# Patient Record
Sex: Male | Born: 1965 | Race: White | Hispanic: No | Marital: Married | Smoking: Never smoker
Health system: Southern US, Community
[De-identification: ages and names within clinical notes are randomized; demographics above are authoritative.]

## PROBLEM LIST (undated history)

## (undated) DIAGNOSIS — K219 Gastro-esophageal reflux disease without esophagitis: Secondary | ICD-10-CM

## (undated) DIAGNOSIS — E7800 Pure hypercholesterolemia, unspecified: Secondary | ICD-10-CM

## (undated) DIAGNOSIS — IMO0001 Reserved for inherently not codable concepts without codable children: Secondary | ICD-10-CM

## (undated) DIAGNOSIS — J302 Other seasonal allergic rhinitis: Secondary | ICD-10-CM

## (undated) DIAGNOSIS — G4730 Sleep apnea, unspecified: Secondary | ICD-10-CM

## (undated) DIAGNOSIS — L309 Dermatitis, unspecified: Secondary | ICD-10-CM

## (undated) HISTORY — PX: CIRCUMCISION: SUR203

---

## 2011-01-01 ENCOUNTER — Encounter: Payer: Self-pay | Admitting: *Deleted

## 2011-01-01 ENCOUNTER — Emergency Department (HOSPITAL_BASED_OUTPATIENT_CLINIC_OR_DEPARTMENT_OTHER)
Admission: EM | Admit: 2011-01-01 | Discharge: 2011-01-01 | Disposition: A | Discharge status: Home / Self Care | Payer: Managed Care, Other (non HMO) | Attending: Emergency Medicine | Admitting: Emergency Medicine

## 2011-01-01 ENCOUNTER — Emergency Department (INDEPENDENT_AMBULATORY_CARE_PROVIDER_SITE_OTHER): Payer: Managed Care, Other (non HMO)

## 2011-01-01 DIAGNOSIS — E7800 Pure hypercholesterolemia, unspecified: Secondary | ICD-10-CM | POA: Insufficient documentation

## 2011-01-01 DIAGNOSIS — R51 Headache: Secondary | ICD-10-CM

## 2011-01-01 DIAGNOSIS — R1110 Vomiting, unspecified: Secondary | ICD-10-CM | POA: Insufficient documentation

## 2011-01-01 HISTORY — DX: Other seasonal allergic rhinitis: J30.2

## 2011-01-01 HISTORY — DX: Sleep apnea, unspecified: G47.30

## 2011-01-01 HISTORY — DX: Gastro-esophageal reflux disease without esophagitis: K21.9

## 2011-01-01 HISTORY — DX: Pure hypercholesterolemia, unspecified: E78.00

## 2011-01-01 HISTORY — DX: Reserved for inherently not codable concepts without codable children: IMO0001

## 2011-01-01 LAB — CBC
Hemoglobin: 14.9 g/dL (ref 13.0–17.0)
MCHC: 35.7 g/dL (ref 30.0–36.0)
RDW: 13.9 % (ref 11.5–15.5)

## 2011-01-01 LAB — COMPREHENSIVE METABOLIC PANEL
AST: 17 U/L (ref 0–37)
Albumin: 4 g/dL (ref 3.5–5.2)
Alkaline Phosphatase: 71 U/L (ref 39–117)
Chloride: 105 mEq/L (ref 96–112)
Potassium: 3.8 mEq/L (ref 3.5–5.1)
Sodium: 140 mEq/L (ref 135–145)
Total Bilirubin: 0.3 mg/dL (ref 0.3–1.2)

## 2011-01-01 LAB — DIFFERENTIAL
Basophils Absolute: 0.1 10*3/uL (ref 0.0–0.1)
Basophils Relative: 1 % (ref 0–1)
Neutro Abs: 4.9 10*3/uL (ref 1.7–7.7)
Neutrophils Relative %: 61 % (ref 43–77)

## 2011-01-01 MED ORDER — ONDANSETRON HCL 4 MG/2ML IJ SOLN
4.0000 mg | Freq: Once | INTRAMUSCULAR | Status: AC
  Administered 2011-01-01: 4 mg via INTRAVENOUS
  Filled 2011-01-01: qty 2

## 2011-01-01 MED ORDER — ONDANSETRON 8 MG PO TBDP: 8.0000 mg | ORAL_TABLET | Freq: Three times a day (TID) | ORAL | Status: AC | PRN

## 2011-01-01 MED ORDER — PROMETHAZINE HCL 25 MG/ML IJ SOLN
12.5000 mg | Freq: Once | INTRAMUSCULAR | Status: AC
  Administered 2011-01-01: 12.5 mg via INTRAVENOUS
  Filled 2011-01-01: qty 1

## 2011-01-01 MED ORDER — SODIUM CHLORIDE 0.9 % IV BOLUS (SEPSIS)
500.0000 mL | Freq: Once | INTRAVENOUS | Status: AC
  Administered 2011-01-01: 500 mL via INTRAVENOUS

## 2011-01-01 MED ORDER — SODIUM CHLORIDE 0.9 % IV SOLN
Freq: Once | INTRAVENOUS | Status: AC
  Administered 2011-01-01: 21:00:00 via INTRAVENOUS

## 2011-01-01 MED ORDER — KETOROLAC TROMETHAMINE 30 MG/ML IJ SOLN
30.0000 mg | Freq: Once | INTRAMUSCULAR | Status: AC
  Administered 2011-01-01: 30 mg via INTRAVENOUS
  Filled 2011-01-01: qty 1

## 2011-01-01 NOTE — ED Notes (Addendum)
rx x 1 given for zofran ODT. Pt has family here to drive

## 2011-01-01 NOTE — ED Notes (Signed)
Transported to xray 

## 2011-01-01 NOTE — ED Notes (Signed)
Pt reports nausea relieved after phenergan

## 2011-01-01 NOTE — ED Notes (Signed)
Pt actively vomiting- EDP Notified- [facility] for phenergan 12.5mg 

## 2011-01-01 NOTE — ED Provider Notes (Signed)
Scribed for Geoffery Lyons, MD, the patient was seen in room MH04/MH04 . This chart was scribed by Ellie Lunch. This patient's care was started at 7:54 PM.   CSN: [id number] Arrival date & time: 01/01/2011  7:27 PM  Chief Complaint  Patient presents with  . Emesis    (Consider location/radiation/quality/duration/timing/severity/associated sxs/prior treatment) HPI Lucas Woods is a normally healthy 45 y.o. male who presents to the Emergency Department complaining of HA with associated emesis starting last night. Pt reports a HA starting last night and having one episode of emesis. A HA developed again around 4pm today. Pain is described as constant and rated 5/10 in severity. Pt treated with 2 tesnion HA pills with little improvement. Pt also reports additional episodes of emesis since onset. Pt says he sees little flashing lights after vomiting similar to flashing lights you would see before fainting. HA is located in the frontal forehead region. Pt denies any other visual disturbance, abd pain, fever, diarrhea. No reported sick contacts. No suspicious food.  No H/o of migraines or HA.  Additionally Pt reports he Increased his dose of testosterone from 1 to 3 "squirts" starting yesterday. Testosterone 30 mg.   Past Medical History  Diagnosis Date  . Seasonal allergies   . Reflux   . Hypercholesteremia   Kleinfelders  Past Surgical History  Procedure Date  . Circumcision     History reviewed. No pertinent family history.  History  Substance Use Topics  . Smoking status: Never Smoker   . Smokeless tobacco: Not on file  . Alcohol Use: Yes    Review of Systems 10 Systems reviewed and are negative for acute change except as noted in the HPI.  Allergies  Review of patient's allergies indicates no known allergies.  Home Medications   Current Outpatient Rx  Name Route Sig Dispense Refill  . CLONAZEPAM 0.5 MG PO TABS Oral Take 0.5 mg by mouth at bedtime.      Marland Kitchen DIPHENHYDRAMINE  HCL 25 MG PO TABS Oral Take 50 mg by mouth once as needed. For allergies     . GEMFIBROZIL 600 MG PO TABS Oral Take 600 mg by mouth 2 (two) times daily before a meal.      . IBUPROFEN 200 MG PO TABS Oral Take 600 mg by mouth once as needed. For pain     . OMEPRAZOLE 20 MG PO CPDR Oral Take 20 mg by mouth daily.      Marland Kitchen PHENYLTOLOXAMINE-ACETAMINOPHEN 30-325 MG PO TABS Oral Take 2 tablets by mouth once as needed. For headache    . TESTOSTERONE 30 MG/ACT TD SOLN Transdermal Place 3 Squirts onto the skin daily.        BP 159/105  Pulse 88  Temp(Src) 97.5 F (36.4 C) (Oral)  Resp 20  Ht 5\' 7"  (1.702 m)  Wt 180 lb (81.647 kg)  BMI 28.19 kg/m2  SpO2 100%  Physical Exam  Nursing note and vitals reviewed. Constitutional: He is oriented to person, place, and time. He appears well-developed and well-nourished.  HENT:  Head: Normocephalic and atraumatic.  Mouth/Throat: Oropharynx is clear and moist.  Eyes: Conjunctivae and EOM are normal. Pupils are equal, round, and reactive to light.  Neck: Normal range of motion. Neck supple.  Cardiovascular: Normal rate, regular rhythm and normal heart sounds.   Pulmonary/Chest: Effort normal and breath sounds normal. No respiratory distress.  Abdominal: Soft. There is no tenderness. There is no CVA tenderness.  Musculoskeletal: Normal range of motion.  Neurological: He is  alert and oriented to person, place, and time. No cranial nerve deficit or sensory deficit.  Skin: Skin is warm and dry.  Psychiatric: He has a normal mood and affect.   Procedures (including critical care time)  OTHER DATA REVIEWED: Nursing notes, vital signs, and past medical records reviewed.  DIAGNOSTIC STUDIES: Oxygen Saturation is 100% on room air, normal by my interpretation.    LABS / RADIOLOGY:  Labs Reviewed  DIFFERENTIAL - Abnormal; Notable for the following:    Eosinophils Relative 7 (*)    All other components within normal limits  COMPREHENSIVE METABOLIC PANEL -  Abnormal; Notable for the following:    GFR calc non Af Amer 73 (*)    GFR calc Af Amer 84 (*)    All other components within normal limits  CBC   Ct Head Wo Contrast  01/01/2011  *RADIOLOGY REPORT*  Clinical Data: Unexplained vomiting since last night.  No injury or trauma.  CT HEAD WITHOUT CONTRAST  Technique:  Contiguous axial images were obtained from the base of the skull through the vertex without contrast.  Comparison: None.  Findings: Ventricles and sulci appear symmetrical.  No mass effect or midline shift.  No abnormal extra-axial fluid collections.  Wallace Cullens- white matter junctions are distinct.  Basal cisterns are not effaced.  No evidence of acute intracranial hemorrhage.  No depressed skull fractures.  Focal mucosal membrane thickening or retention cyst in the sphenoid sinus.  Opacification of the right ethmoid air cell.  No acute air-fluid levels demonstrated in the paranasal sinuses.  IMPRESSION: No evidence of acute intracranial hemorrhage, mass lesion, or acute infarct.  Original Report Authenticated By: Marlon Pel, M.D.    ED COURSE / COORDINATION OF CARE: 20:00 EDP at Pt bedside. Discussed treatment plan including fluids, nausea and pain medication. Plan to CT of head.   MDM: Ct of head and labs look okay.  It appears as though this is some sort of migraine or viral enteritis.  No apparent emergent pathology.  Feels better with meds.  Will discharge with antiemetics and time.  MEDICATIONS GIVEN IN THE E.D.  Medications  0.9 %  sodium chloride infusion (0  Intravenous Stopped 01/01/11 2117)  ketorolac (TORADOL) 30 MG/ML injection 30 mg (30 mg Intravenous Given 01/01/11 2030)  ondansetron (ZOFRAN) injection 4 mg (4 mg Intravenous Given 01/01/11 2033)  promethazine (PHENERGAN) injection 12.5 mg (12.5 mg Intravenous Given 01/01/11 2121)  sodium chloride 0.9 % bolus 500 mL (500 mL Intravenous Given 01/01/11 2121)   DISCHARGE MEDICATIONS: New Prescriptions   ONDANSETRON (ZOFRAN  ODT) 8 MG DISINTEGRATING TABLET    Take 1 tablet (8 mg total) by mouth every 8 (eight) hours as needed for nausea.    SCRIBE ATTESTATION: I personally performed the services described in this documentation, which was scribed in my presence. The recorded information has been reviewed and considered.          Geoffery Lyons, MD 01/01/11 2255

## 2011-01-01 NOTE — ED Notes (Signed)
Pt states he began vomiting last p.m. "Seeing flashes of light"

## 2011-01-01 NOTE — ED Notes (Signed)
MD at bedside. EDP Delo in with pt 

## 2012-10-19 IMAGING — CT CT HEAD W/O CM
1 series · 16 of 30 positions shown, 20 images · non-contrast
Comparison: None.

CLINICAL DATA: Unexplained vomiting since last night.  No injury or
trauma.

CT HEAD WITHOUT CONTRAST
TECHNIQUE: Contiguous axial images were obtained from the base of
the skull through the vertex without contrast.

[Series 2: head 4.8 h37s · axial · 0.47mm/px · z∈[-105,+55]mm · 16 of 36 slices shown, 20 images]
[im 2/36  brain]
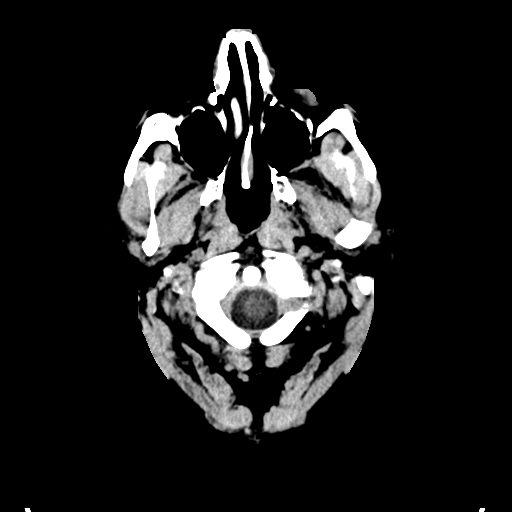
[im 2/36  bone]
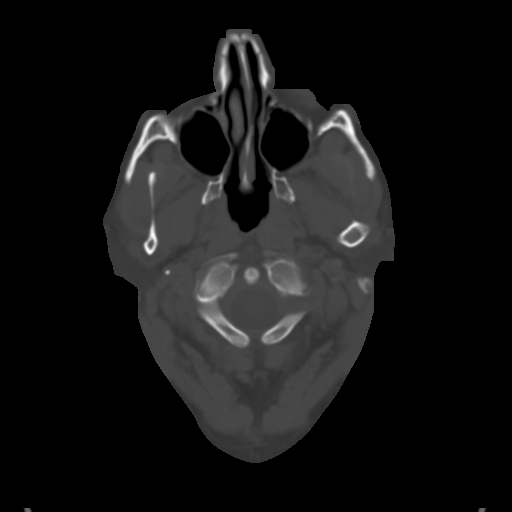
[im 4/36  brain]
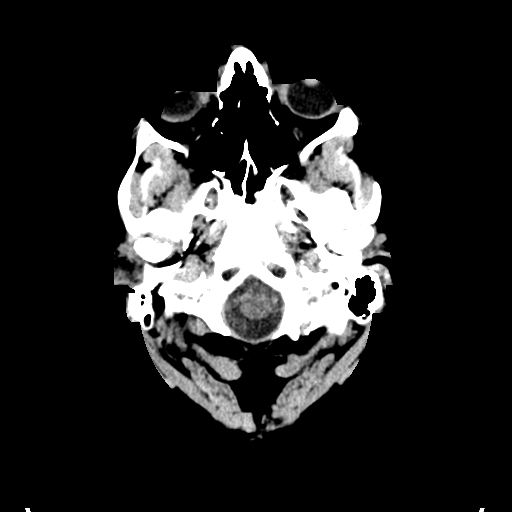
[im 7/36  brain]
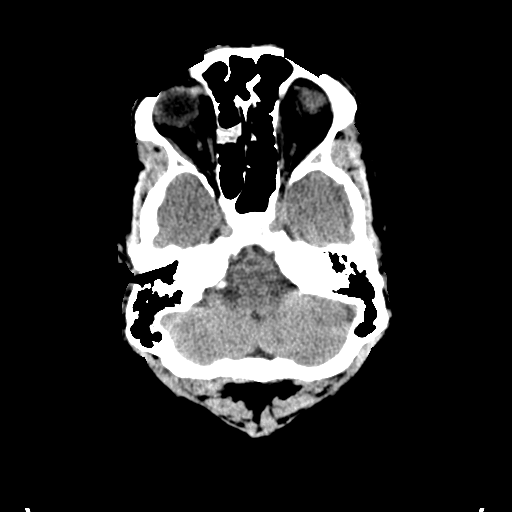
[im 9/36  brain]
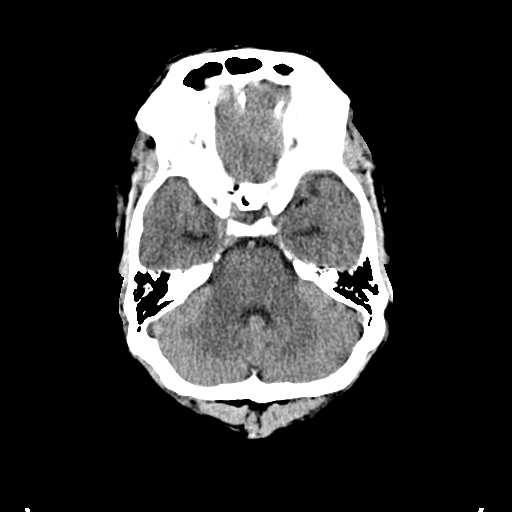
[im 10/36  brain]
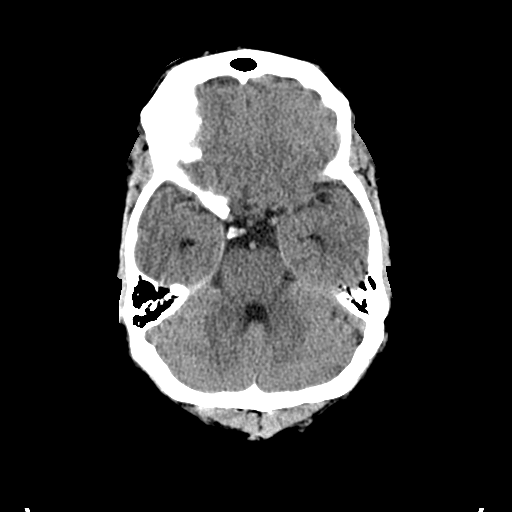
[im 10/36  bone]
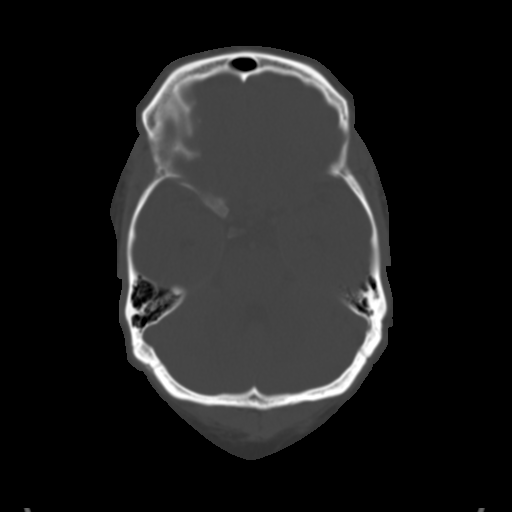
[im 13/36  brain]
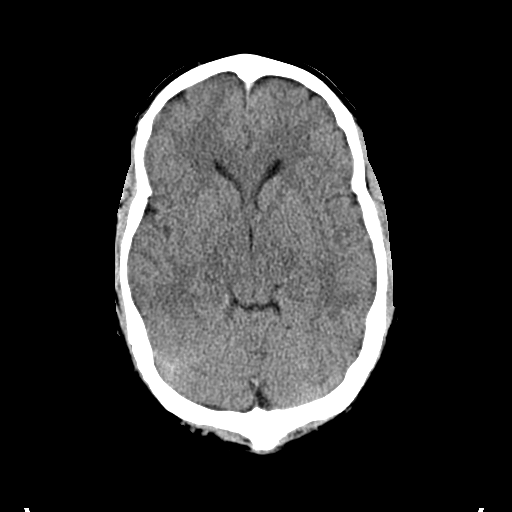
[im 15/36  brain]
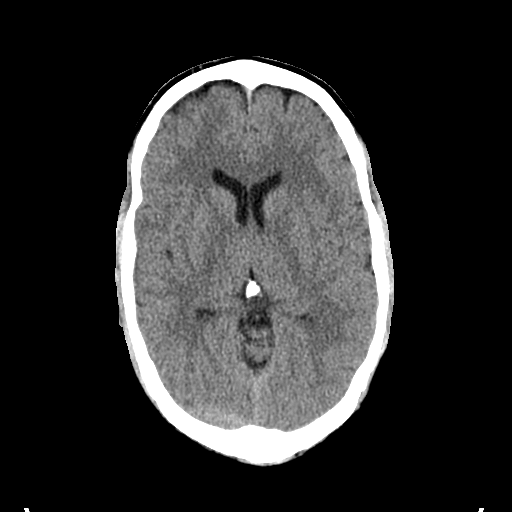
[im 17/36  brain]
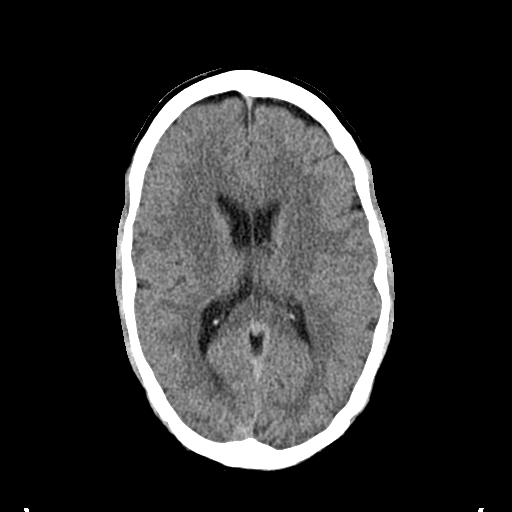
[im 19/36  brain]
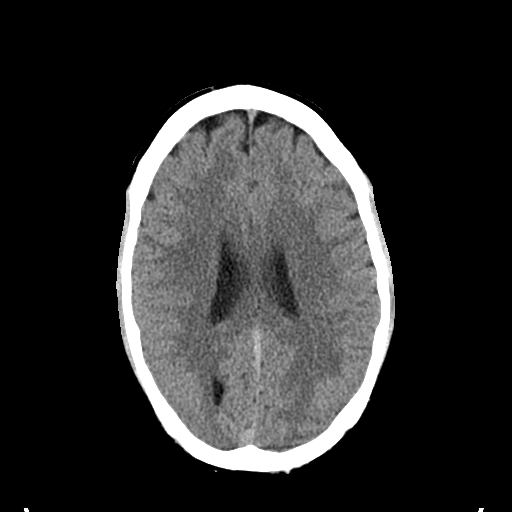
[im 19/36  bone]
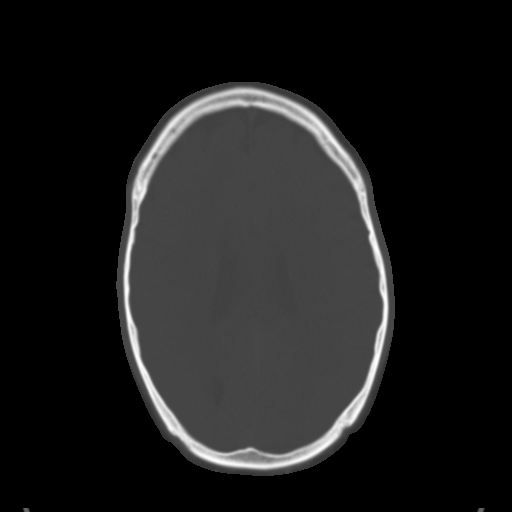
[im 21/36  brain]
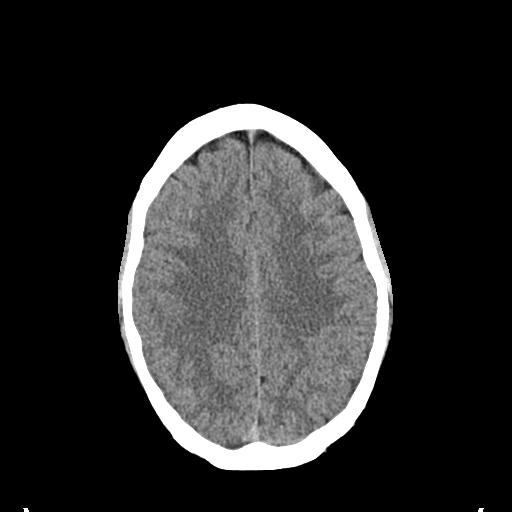
[im 23/36  brain]
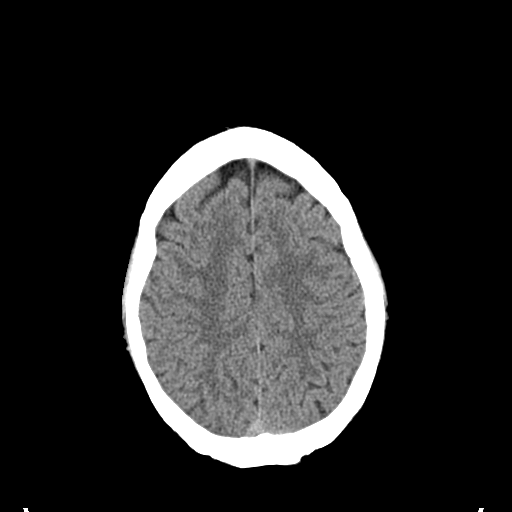
[im 26/36  brain]
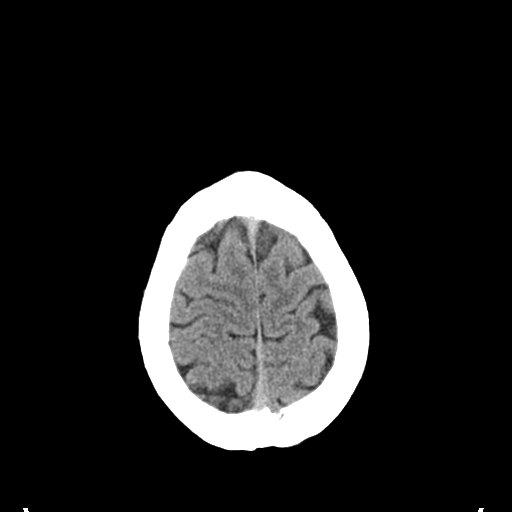
[im 27/36  brain]
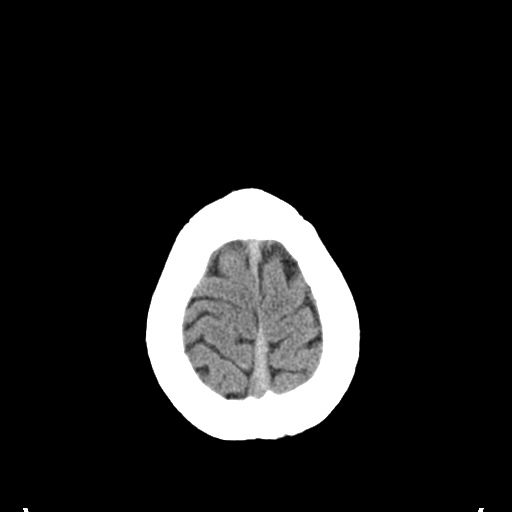
[im 27/36  bone]
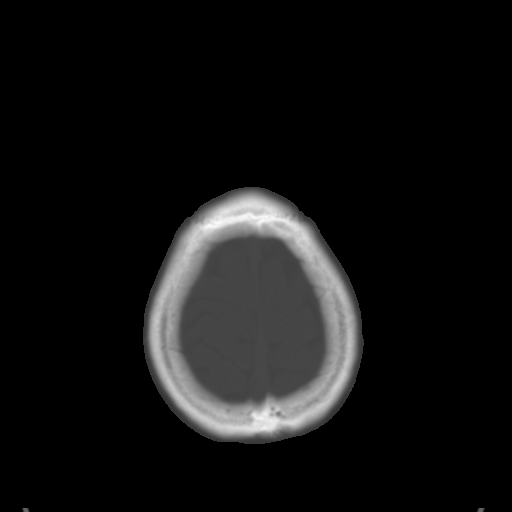
[im 29/36  brain]
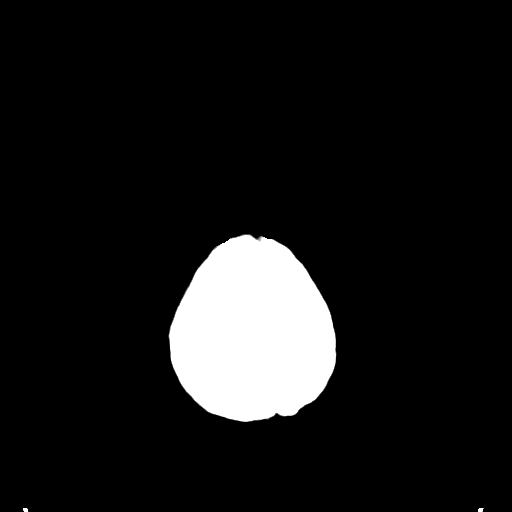
[im 32/36  brain]
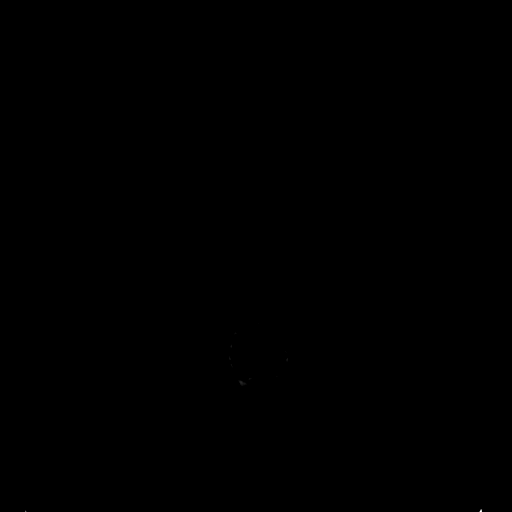
[im 34/36  brain]
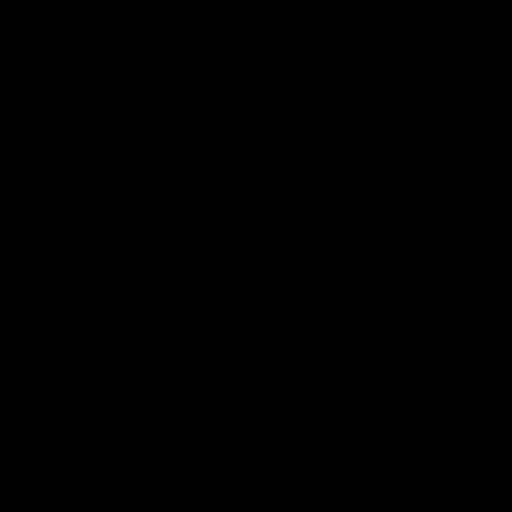

[16 of 30 positions shown; findings below may reference images not displayed]

FINDINGS: Ventricles and sulci appear symmetrical.  No mass effect
or midline shift.  No abnormal extra-axial fluid collections.  Gray-
white matter junctions are distinct.  Basal cisterns are not
effaced.  No evidence of acute intracranial hemorrhage.  No
depressed skull fractures.  Focal mucosal membrane thickening or
retention cyst in the sphenoid sinus.  Opacification of the right
ethmoid air cell.  No acute air-fluid levels demonstrated in the
paranasal sinuses.
IMPRESSION: No evidence of acute intracranial hemorrhage, mass lesion, or acute
infarct.

## 2013-10-30 ENCOUNTER — Institutional Professional Consult (permissible substitution): Payer: Managed Care, Other (non HMO) | Admitting: Pulmonary Disease

## 2014-06-07 ENCOUNTER — Emergency Department (HOSPITAL_BASED_OUTPATIENT_CLINIC_OR_DEPARTMENT_OTHER)
Admission: EM | Admit: 2014-06-07 | Discharge: 2014-06-08 | Disposition: A | Discharge status: Home / Self Care | Payer: BLUE CROSS/BLUE SHIELD | Attending: Emergency Medicine | Admitting: Emergency Medicine

## 2014-06-07 ENCOUNTER — Encounter (HOSPITAL_BASED_OUTPATIENT_CLINIC_OR_DEPARTMENT_OTHER): Payer: Self-pay | Admitting: Emergency Medicine

## 2014-06-07 DIAGNOSIS — Z79899 Other long term (current) drug therapy: Secondary | ICD-10-CM | POA: Diagnosis not present

## 2014-06-07 DIAGNOSIS — Z8639 Personal history of other endocrine, nutritional and metabolic disease: Secondary | ICD-10-CM | POA: Diagnosis not present

## 2014-06-07 DIAGNOSIS — R112 Nausea with vomiting, unspecified: Secondary | ICD-10-CM | POA: Insufficient documentation

## 2014-06-07 DIAGNOSIS — R51 Headache: Secondary | ICD-10-CM | POA: Insufficient documentation

## 2014-06-07 DIAGNOSIS — K219 Gastro-esophageal reflux disease without esophagitis: Secondary | ICD-10-CM | POA: Diagnosis not present

## 2014-06-07 DIAGNOSIS — Z8669 Personal history of other diseases of the nervous system and sense organs: Secondary | ICD-10-CM | POA: Diagnosis not present

## 2014-06-07 DIAGNOSIS — R519 Headache, unspecified: Secondary | ICD-10-CM

## 2014-06-07 MED ORDER — METOCLOPRAMIDE HCL 5 MG/ML IJ SOLN
10.0000 mg | Freq: Once | INTRAMUSCULAR | Status: AC
  Administered 2014-06-07: 10 mg via INTRAVENOUS
  Filled 2014-06-07: qty 2

## 2014-06-07 MED ORDER — KETOROLAC TROMETHAMINE 30 MG/ML IJ SOLN
30.0000 mg | Freq: Once | INTRAMUSCULAR | Status: AC
  Administered 2014-06-07: 30 mg via INTRAVENOUS
  Filled 2014-06-07: qty 1

## 2014-06-07 MED ORDER — SODIUM CHLORIDE 0.9 % IV BOLUS (SEPSIS)
1000.0000 mL | Freq: Once | INTRAVENOUS | Status: AC
  Administered 2014-06-07: 1000 mL via INTRAVENOUS

## 2014-06-07 MED ORDER — DIPHENHYDRAMINE HCL 50 MG/ML IJ SOLN
25.0000 mg | Freq: Once | INTRAMUSCULAR | Status: AC
  Administered 2014-06-07: 25 mg via INTRAVENOUS
  Filled 2014-06-07: qty 1

## 2014-06-07 NOTE — ED Notes (Signed)
Pt awoke this morning with headache.  After lunch, pt started feeling nauseous as well as headache.  Pt took one phenergan and imitrex as prescribed for him, felt cold and clammy per patient.  After taking medicines, pt feels less nauseous, but headache still persists.

## 2014-06-07 NOTE — ED Notes (Signed)
Pt re4ports headache with hx migraines Imitrex 1 hr ago w/o relief

## 2014-06-08 NOTE — Discharge Instructions (Signed)
Make sure to get plenty of rest. Excedrin headache for needed tomorrow. Rest. Avoid stressors. Follow with primary care doctor.    General Headache Without Cause A headache is pain or discomfort felt around the head or neck area. The specific cause of a headache may not be found. There are many causes and types of headaches. A few common ones are:  Tension headaches.  Migraine headaches.  Cluster headaches.  Chronic daily headaches. HOME CARE INSTRUCTIONS   Keep all follow-up appointments with your caregiver or any specialist referral.  Only take over-the-counter or prescription medicines for pain or discomfort as directed by your caregiver.  Lie down in a dark, quiet room when you have a headache.  Keep a headache journal to find out what may trigger your migraine headaches. For example, write down:  What you eat and drink.  How much sleep you get.  Any change to your diet or medicines.  Try massage or other relaxation techniques.  Put ice packs or heat on the head and neck. Use these 3 to 4 times per day for 15 to 20 minutes each time, or as needed.  Limit stress.  Sit up straight, and do not tense your muscles.  Quit smoking if you smoke.  Limit alcohol use.  Decrease the amount of caffeine you drink, or stop drinking caffeine.  Eat and sleep on a regular schedule.  Get 7 to 9 hours of sleep, or as recommended by your caregiver.  Keep lights dim if bright lights bother you and make your headaches worse. SEEK MEDICAL CARE IF:   You have problems with the medicines you were prescribed.  Your medicines are not working.  You have a change from the usual headache.  You have nausea or vomiting. SEEK IMMEDIATE MEDICAL CARE IF:   Your headache becomes severe.  You have a fever.  You have a stiff neck.  You have loss of vision.  You have muscular weakness or loss of muscle control.  You start losing your balance or have trouble walking.  You feel  faint or pass out.  You have severe symptoms that are different from your first symptoms. MAKE SURE YOU:   Understand these instructions.  Will watch your condition.  Will get help right away if you are not doing well or get worse. Document Released: 03/13/2005 Document Revised: 06/05/2011 Document Reviewed: 03/29/2011 [facility] Patient Information 2015 [employer], [place]. This information is not intended to replace advice given to you by your health care provider. Make sure you discuss any questions you have with your health care provider.

## 2014-06-08 NOTE — ED Provider Notes (Signed)
CSN: [id number]     Arrival date & time 06/07/14  2030 History   First MD Initiated Contact with Patient 06/07/14 2238     Chief Complaint  Patient presents with  . Migraine     (Consider location/radiation/quality/duration/timing/severity/associated sxs/prior Treatment) HPI Lucas Woods is a 49 y.o. male with history of seasonal allergies, migraines, GERD, presents to ED with complaint of a headache. Patient states the headache started this morning. States that it gradually worsened throughout the day. He reports associated nausea, one episode of vomiting. He reports associated photophobia. He denies any sinus congestion. He denies any neck pain or stiffness. He denies visual changes. He states onset of headache with gradual as well. His wife gave him Imitrex which did not help. He also tried Phenergan later in the day which did not help. He states he has had similar headaches in the past. He also admits to being under a lot of stress recently. He has tried many his headache better today. Headache sharp, across the front of his head.  Past Medical History  Diagnosis Date  . Seasonal allergies   . Reflux   . Hypercholesteremia   . Sleep apnea    Past Surgical History  Procedure Laterality Date  . Circumcision     History reviewed. No pertinent family history. History  Substance Use Topics  . Smoking status: Never Smoker   . Smokeless tobacco: Not on file  . Alcohol Use: Yes    Review of Systems  Constitutional: Negative for fever and chills.  Eyes: Positive for photophobia. Negative for pain and redness.  Respiratory: Negative for cough, chest tightness and shortness of breath.   Cardiovascular: Negative for chest pain, palpitations and leg swelling.  Gastrointestinal: Positive for nausea and vomiting. Negative for abdominal pain, diarrhea and abdominal distention.  Genitourinary: Negative for dysuria, urgency, frequency and hematuria.  Musculoskeletal: Negative for myalgias,  arthralgias, neck pain and neck stiffness.  Skin: Negative for rash.  Allergic/Immunologic: Negative for immunocompromised state.  Neurological: Positive for headaches. Negative for dizziness, weakness, light-headedness and numbness.  All other systems reviewed and are negative.     Allergies  Review of patient's allergies indicates no known allergies.  Home Medications   Prior to Admission medications   Medication Sig Start Date End Date Taking? Authorizing Provider  clonazePAM (KLONOPIN) 0.5 MG tablet Take 0.5 mg by mouth at bedtime.      Historical Provider, MD  diphenhydrAMINE (BENADRYL) 25 MG tablet Take 50 mg by mouth once as needed. For allergies     Historical Provider, MD  gemfibrozil (LOPID) 600 MG tablet Take 600 mg by mouth 2 (two) times daily before a meal.      Historical Provider, MD  ibuprofen (ADVIL,MOTRIN) 200 MG tablet Take 600 mg by mouth once as needed. For pain     Historical Provider, MD  omeprazole (PRILOSEC) 20 MG capsule Take 20 mg by mouth daily.      Historical Provider, MD  phenyltoloxamine-acetaminophen 30-325 MG per tablet Take 2 tablets by mouth once as needed. For headache    Historical Provider, MD  Testosterone (AXIRON) 30 MG/ACT SOLN Place 3 Squirts onto the skin daily.      Historical Provider, MD   BP 151/108 mmHg  Pulse 89  Temp(Src) 98 F (36.7 C) (Oral)  Resp 20  Ht  (1.702 m)  Wt 190 lb (86.183 kg)  BMI 29.75 kg/m2  SpO2 100% Physical Exam  Constitutional: He is oriented to person, place, and time.  He appears well-developed and well-nourished. No distress.  HENT:  Head: Normocephalic and atraumatic.  Right Ear: External ear normal.  Left Ear: External ear normal.  Nose: Nose normal.  Mouth/Throat: Oropharynx is clear and moist.  Eyes: Conjunctivae and EOM are normal. Pupils are equal, round, and reactive to light.  Neck: Normal range of motion. Neck supple.  Cardiovascular: Normal rate, regular rhythm and normal heart sounds.    Pulmonary/Chest: Effort normal and breath sounds normal. No respiratory distress. He has no wheezes. He has no rales.  Abdominal: Soft. There is no tenderness.  Musculoskeletal: He exhibits no edema.  Neurological: He is alert and oriented to person, place, and time.  5/5 and equal upper and lower extremity strength bilaterally. Equal grip strength bilaterally. Normal finger to nose and heel to shin. No pronator drift.   Skin: Skin is warm and dry.  Nursing note and vitals reviewed.   ED Course  Procedures (including critical care time) Labs Review Labs Reviewed - No data to display  Imaging Review No results found.   EKG Interpretation None      MDM   Final diagnoses:  Nonintractable headache, unspecified chronicity pattern, unspecified headache type    patient with history of migraines, here with gradual onset of headache this morning with associated photophobia and nausea and vomiting. History of similar headaches in the past. He has not had any head injuries. He denies any neurological symptoms. Exam is unremarkable. Most likely another migraine headache. Will treat with migraine cocktail which includes Toradol, Reglan, Benadryl.  12:40 AM  Patient is feeling much better. Headache resolved. He wants to go home. We'll discharge home with Excedrin Migraine. Follow-up with primary care doctor.  Filed Vitals:   06/07/14 2050 06/08/14 0034  BP: 151/108 123/84  Pulse: 89 92  Temp: 98 F (36.7 C) 97.9 F (36.6 C)  TempSrc: Oral Oral  Resp: 20 18  Height: 5\' 7"  (1.702 m)   Weight: 190 lb (86.183 kg)   SpO2: 100% 97%     Jaynie Crumbleatyana Morrill Bomkamp, PA-C 06/08/14 0114  Jerelyn ScottMartha Linker, MD 06/09/14 [phone]

## 2014-10-06 ENCOUNTER — Emergency Department
Admission: EM | Admit: 2014-10-06 | Discharge: 2014-10-06 | Disposition: A | Discharge status: Home / Self Care | Payer: BLUE CROSS/BLUE SHIELD | Source: Home / Self Care | Attending: Family Medicine | Admitting: Family Medicine

## 2014-10-06 ENCOUNTER — Encounter: Payer: Self-pay | Admitting: *Deleted

## 2014-10-06 DIAGNOSIS — L309 Dermatitis, unspecified: Secondary | ICD-10-CM | POA: Diagnosis not present

## 2014-10-06 DIAGNOSIS — L259 Unspecified contact dermatitis, unspecified cause: Secondary | ICD-10-CM

## 2014-10-06 MED ORDER — TRIAMCINOLONE ACETONIDE 40 MG/ML IJ SUSP
40.0000 mg | Freq: Once | INTRAMUSCULAR | Status: AC
  Administered 2014-10-06: 40 mg via INTRAMUSCULAR

## 2014-10-06 MED ORDER — PREDNISONE 20 MG PO TABS: ORAL_TABLET | ORAL | Status: DC

## 2014-10-06 MED ORDER — TRIAMCINOLONE ACETONIDE 0.1 % EX CREA: 1.0000 "application " | TOPICAL_CREAM | Freq: Two times a day (BID) | CUTANEOUS | Status: AC

## 2014-10-06 NOTE — Discharge Instructions (Signed)
Today you have been diagnosed with contact dermatitis, likely from poison ivy.  Be sure to wash all clothing, sheets and towels used from time of contact with poison ivy until now.  Gently wash the areas of rash with warm soapy water and pat dry, do not rub.    Today you were given a steroid injection of Kenalog.  Please allow at least 24 hours to see if symptoms are improving.  If only minimal improvement, you may start prednisone taper and apply triamcinolone cream.  As rash improves, please cut back on triamcinolone cream.   You may take an antihistamine such as Benadryl or Zyrtec as needed for itch, however, steroid should help with this.    Follow-up with primary care provider in one week if not improving.  Please seek medical care sooner if worsening including spreading of rash, increased pain, fever or other new concerning symptoms develop

## 2014-10-06 NOTE — ED Notes (Signed)
Pt c/o rash on his arms bilaterally x 3 days.

## 2014-10-06 NOTE — ED Provider Notes (Signed)
CSN: [id number]     Arrival date & time 10/06/14  1837 History   First MD Initiated Contact with Patient 10/06/14 1844     Chief Complaint  Patient presents with  . Rash   (Consider location/radiation/quality/duration/timing/severity/associated sxs/prior Treatment) HPI Patient is a 49 year old male presenting to urgent care with complaint of pruritic rash on bilateral arms that started 3 days ago after working in the yard.  Patient states he did come in contact with poison ivy and since then rash is worsening.  Patient states he has had a similar rash previously.  States he was seen for this same rash 2 days ago and was prescribed triamcinolone cream, which provided minimal relief.  Denies fevers, chills, nausea, vomiting, diarrhea.  Denies difficulty breathing or swallowing.  No other rashes on his body.  Patient does report history of eczema.  States he does have smaller lesions are consistent with his chronic eczema.  No sick contacts or recent travel.  No history of tick bites.  Past Medical History  Diagnosis Date  . Seasonal allergies   . Reflux   . Hypercholesteremia   . Sleep apnea    Past Surgical History  Procedure Laterality Date  . Circumcision     Family History  Problem Relation Age of Onset  . Diabetes Mother   . Heart failure Mother    History  Substance Use Topics  . Smoking status: Never Smoker   . Smokeless tobacco: Not on file  . Alcohol Use: No    Review of Systems  Constitutional: Negative for fever and chills.  Respiratory: Negative for cough, shortness of breath, wheezing and stridor.   Cardiovascular: Negative for chest pain and palpitations.  Gastrointestinal: Negative for nausea, vomiting, abdominal pain and diarrhea.  Musculoskeletal: Negative for myalgias, arthralgias, neck pain and neck stiffness.  Skin: Positive for rash and wound. Negative for color change.    Allergies  Review of patient's allergies indicates no known allergies.  Home  Medications   Prior to Admission medications   Medication Sig Start Date End Date Taking? Authorizing Provider  Armodafinil (NUVIGIL) 200 MG TABS Take by mouth.   Yes Historical Provider, MD  atorvastatin (LIPITOR) 10 MG tablet Take 10 mg by mouth daily.   Yes Historical Provider, MD  clomiPRAMINE (ANAFRANIL) 75 MG capsule Take 75 mg by mouth at bedtime.   Yes Historical Provider, MD  clotrimazole (LOTRIMIN) 1 % cream Apply 1 application topically 2 (two) times daily.   Yes Historical Provider, MD  Dexlansoprazole (DEXILANT PO) Take by mouth.   Yes Historical Provider, MD  diphenhydrAMINE (SOMINEX) 25 MG tablet Take 25 mg by mouth at bedtime as needed for sleep.   Yes Historical Provider, MD  folic acid (FOLVITE) 1 MG tablet Take 1 mg by mouth daily.   Yes Historical Provider, MD  meloxicam (MOBIC) 15 MG tablet Take 15 mg by mouth daily.   Yes Historical Provider, MD  Testosterone Undecanoate (AVEED IM) Inject into the muscle.   Yes Historical Provider, MD  clonazePAM (KLONOPIN) 0.5 MG tablet Take 0.5 mg by mouth at bedtime.      Historical Provider, MD  diphenhydrAMINE (BENADRYL) 25 MG tablet Take 50 mg by mouth once as needed. For allergies     Historical Provider, MD  gemfibrozil (LOPID) 600 MG tablet Take 600 mg by mouth 2 (two) times daily before a meal.      Historical Provider, MD  ibuprofen (ADVIL,MOTRIN) 200 MG tablet Take 600 mg by mouth once as  needed. For pain     Historical Provider, MD  omeprazole (PRILOSEC) 20 MG capsule Take 20 mg by mouth daily.      Historical Provider, MD  phenyltoloxamine-acetaminophen 30-325 MG per tablet Take 2 tablets by mouth once as needed. For headache    Historical Provider, MD  predniSONE (DELTASONE) 20 MG tablet 3 tabs po day one, then 2 po daily x 4 days 10/06/14   Junius Finner, PA-C  Testosterone (AXIRON) 30 MG/ACT SOLN Place 3 Squirts onto the skin daily.      Historical Provider, MD  triamcinolone cream (KENALOG) 0.1 % Apply 1 application  topically 2 (two) times daily. 10/06/14   Junius Finner, PA-C   BP 127/94 mmHg  Pulse 100  Temp(Src) 98.2 F (36.8 C) (Oral)  Resp 18  Ht  (1.676 m)  Wt 188 lb (85.276 kg)  BMI 30.36 kg/m2  SpO2 97% Physical Exam  Constitutional: He is oriented to person, place, and time. He appears well-developed and well-nourished.  HENT:  Head: Normocephalic and atraumatic.  Eyes: EOM are normal.  Neck: Normal range of motion.  Cardiovascular: Normal rate.   Pulmonary/Chest: Effort normal.  Musculoskeletal: Normal range of motion.  Neurological: He is alert and oriented to person, place, and time.  Skin: Skin is warm and dry. Rash noted. There is erythema.  Erythematous swollen diffuse rash on bilateral forearms with satellite lesions. Non-tender. No induration or fluctuance. No bleeding or discharge. Rash c/w contact dermatitis Diffuse circular dried erythematous lesions with excoriation on bilateral elbows and upper arms c/w eczema vs psoriasis   Psychiatric: He has a normal mood and affect. His behavior is normal.  Nursing note and vitals reviewed.   ED Course  Procedures (including critical care time) Labs Review Labs Reviewed - No data to display  Imaging Review No results found.   MDM   1. Contact dermatitis   2. Eczema     Patient is a 49 year old male presenting to urgent care with rash consistent with contact dermatitis.  No evidence of underlying infection or abscess.  Patient also has several lesions consistent with eczema versus psoriasis.  Patient states he is leaving for a cruise in 2 days for 1 week.  Will give patient a Kenalog shot in urgent care.  Advised patient to give at least 24 hours to see if significant improvement.  If only minimal improvement, he may start prednisone taper and triamcinolone cream.  He may also take antihistamine for itch.  Advised to follow-up with PCP in one week or seek medical attention if symptoms worsen.  Patient verbalized  understanding and agreement with treatment plan.     Junius Finner, PA-C 10/06/14 1921

## 2016-04-01 ENCOUNTER — Emergency Department (HOSPITAL_BASED_OUTPATIENT_CLINIC_OR_DEPARTMENT_OTHER)
Admission: EM | Admit: 2016-04-01 | Discharge: 2016-04-01 | Disposition: A | Discharge status: Home / Self Care | Payer: BLUE CROSS/BLUE SHIELD | Attending: Emergency Medicine | Admitting: Emergency Medicine

## 2016-04-01 ENCOUNTER — Encounter (HOSPITAL_BASED_OUTPATIENT_CLINIC_OR_DEPARTMENT_OTHER): Payer: Self-pay | Admitting: Emergency Medicine

## 2016-04-01 DIAGNOSIS — H81399 Other peripheral vertigo, unspecified ear: Secondary | ICD-10-CM

## 2016-04-01 DIAGNOSIS — Z87891 Personal history of nicotine dependence: Secondary | ICD-10-CM | POA: Diagnosis not present

## 2016-04-01 DIAGNOSIS — Z79899 Other long term (current) drug therapy: Secondary | ICD-10-CM | POA: Diagnosis not present

## 2016-04-01 DIAGNOSIS — R42 Dizziness and giddiness: Secondary | ICD-10-CM | POA: Diagnosis present

## 2016-04-01 MED ORDER — MECLIZINE HCL 25 MG PO TABS: 25.0000 mg | ORAL_TABLET | Freq: Three times a day (TID) | ORAL | 0 refills | Status: AC | PRN

## 2016-04-01 MED ORDER — ONDANSETRON 4 MG PO TBDP: 4.0000 mg | ORAL_TABLET | Freq: Three times a day (TID) | ORAL | 0 refills | Status: DC | PRN

## 2016-04-01 MED ORDER — MECLIZINE HCL 25 MG PO TABS
25.0000 mg | ORAL_TABLET | Freq: Once | ORAL | Status: AC
  Administered 2016-04-01: 25 mg via ORAL
  Filled 2016-04-01: qty 1

## 2016-04-01 MED ORDER — FLUTICASONE PROPIONATE 50 MCG/ACT NA SUSP: 1.0000 | Freq: Two times a day (BID) | NASAL | 2 refills | Status: AC

## 2016-04-01 NOTE — ED Provider Notes (Signed)
[facility] Note   CSN: [id number] Arrival date & time: 04/01/16  1413   By signing my name below, I, Avnee Patel, attest that this documentation has been prepared under the direction and in the presence of Gwyneth Sprout, MD  Electronically Signed: Clovis Pu, ED Scribe. 04/01/16. 3:36 PM.   History   Chief Complaint Chief Complaint  Patient presents with  . Dizziness   The history is provided by the patient. No language interpreter was used.   HPI Comments:  Lucas Woods is a 51 y.o. male, with a hx of seasonal allergies, who presents to the Emergency Department complaining of sudden onset, intermittent episodes of dizziness x 1 week. His symptoms suddenly began after laying down flat. His symptoms are worse with sudden movements, in the morning and when he lays flat. Pt also reports nausea and vomiting secondary to his dizziness, right ear discomfort/itching and congestion. No alleviating factors noted. Pt denies visual disturbances, using nasal sprays, any other associated symptoms and any other modifying factors at this time.  No weakness, numbness, speech issues, fever, cough or chest pain.   Past Medical History:  Diagnosis Date  . Hypercholesteremia   . Reflux   . Seasonal allergies   . Sleep apnea     There are no active problems to display for this patient.   Past Surgical History:  Procedure Laterality Date  . CIRCUMCISION       Home Medications    Prior to Admission medications   Medication Sig Start Date End Date Taking? Authorizing Provider  atorvastatin (LIPITOR) 10 MG tablet Take 10 mg by mouth daily.   Yes Historical Provider, MD  clotrimazole (LOTRIMIN) 1 % cream Apply 1 application topically 2 (two) times daily.   Yes Historical Provider, MD  diphenhydrAMINE (BENADRYL) 25 MG tablet Take 50 mg by mouth once as needed. For allergies    Yes Historical Provider, MD  diphenhydrAMINE (SOMINEX) 25 MG tablet Take 25 mg by mouth at  bedtime as needed for sleep.   Yes Historical Provider, MD  folic acid (FOLVITE) 1 MG tablet Take 1 mg by mouth daily.   Yes Historical Provider, MD  ibuprofen (ADVIL,MOTRIN) 200 MG tablet Take 600 mg by mouth once as needed. For pain    Yes Historical Provider, MD  pantoprazole (PROTONIX) 40 MG tablet Take 40 mg by mouth daily.   Yes Historical Provider, MD  Testosterone (AXIRON) 30 MG/ACT SOLN Place 3 Squirts onto the skin daily.     Yes Historical Provider, MD  Testosterone Undecanoate (AVEED IM) Inject into the muscle.   Yes Historical Provider, MD  Armodafinil (NUVIGIL) 200 MG TABS Take by mouth.    Historical Provider, MD  clomiPRAMINE (ANAFRANIL) 75 MG capsule Take 75 mg by mouth at bedtime.    Historical Provider, MD  clonazePAM (KLONOPIN) 0.5 MG tablet Take 0.5 mg by mouth at bedtime.      Historical Provider, MD  Dexlansoprazole (DEXILANT PO) Take by mouth.    Historical Provider, MD  gemfibrozil (LOPID) 600 MG tablet Take 600 mg by mouth 2 (two) times daily before a meal.      Historical Provider, MD  meloxicam (MOBIC) 15 MG tablet Take 15 mg by mouth daily.    Historical Provider, MD  omeprazole (PRILOSEC) 20 MG capsule Take 20 mg by mouth daily.      Historical Provider, MD  phenyltoloxamine-acetaminophen 30-325 MG per tablet Take 2 tablets by mouth once as needed. For headache  Historical Provider, MD  predniSONE (DELTASONE) 20 MG tablet 3 tabs po day one, then 2 po daily x 4 days 10/06/14   Junius Finner, PA-C  triamcinolone cream (KENALOG) 0.1 % Apply 1 application topically 2 (two) times daily. 10/06/14   Junius Finner, PA-C    Family History Family History  Problem Relation Age of Onset  . Diabetes Mother   . Heart failure Mother     Social History Social History  Substance Use Topics  . Smoking status: Never Smoker  . Smokeless tobacco: Former Neurosurgeon  . Alcohol use No     Allergies   Patient has no known allergies.   Review of Systems Review of Systems  HENT:         +ear itching  Eyes: Negative for visual disturbance.  Gastrointestinal: Positive for nausea and vomiting.  Neurological: Positive for dizziness.  All other systems reviewed and are negative.  Physical Exam Updated Vital Signs BP 140/85 (BP Location: Left Arm)   Pulse 93   Temp 98.1 F (36.7 C) (Oral)   Resp 19   Ht 5\' 7"  (1.702 m)   Wt 185 lb (83.9 kg)   SpO2 100%   BMI 28.98 kg/m   Physical Exam  Constitutional: He is oriented to person, place, and time. He appears well-developed and well-nourished.  HENT:  Head: Normocephalic and atraumatic.  Right Ear: A middle ear effusion is present.  Left Ear: A middle ear effusion is present.  Eyes: EOM are normal.  Neck: Normal range of motion.  Cardiovascular: Normal rate and regular rhythm.   Pulmonary/Chest: Effort normal and breath sounds normal. No respiratory distress.  Abdominal: Soft. He exhibits no distension. There is no tenderness.  Musculoskeletal: Normal range of motion.  Neurological: He is alert and oriented to person, place, and time. No cranial nerve deficit.  5/5 strength and sensation in all 4 extremities as well as his face. No cranial nerve deficits. No visual field cuts. Normal heel to shin. No nystagmus.  Skin: Skin is warm and dry.  Psychiatric: He has a normal mood and affect. Judgment normal.  Nursing note and vitals reviewed.   ED Treatments / Results  DIAGNOSTIC STUDIES:  Oxygen Saturation is 100% on RA, normal by my interpretation.    COORDINATION OF CARE:  3:26 PM Discussed treatment plan with pt at bedside and pt agreed to plan.  Labs (all labs ordered are listed, but only abnormal results are displayed) Labs Reviewed - No data to display  EKG  EKG Interpretation  Date/Time:  Saturday April 01 2016 14:27:20 EST Ventricular Rate:  95 PR Interval:  122 QRS Duration: 82 QT Interval:  304 QTC Calculation: 382 R Axis:   11 Text Interpretation:  Normal sinus rhythm Nonspecific T  wave abnormality Abnormal ECG No old tracing to compare Confirmed by Ethelda Chick  MD, SAM [id number]) on 04/01/2016 2:32:28 PM Also confirmed by Ethelda Chick  MD, SAM [phone]), editor Stout CT, Jola Babinski [phone])  on 04/01/2016 2:34:06 PM       Radiology No results found.  Procedures Procedures (including critical care time)  Medications Ordered in ED Medications - No data to display   Initial Impression / Assessment and Plan / ED Course  I have reviewed the triage vital signs and the nursing notes.  Pertinent labs & imaging results that were available during my care of the patient were reviewed by me and considered in my medical decision making (see chart for details).  Clinical Course  Pt with sx most consistent with peripheral vertigo.  No systemic or infectious sx.  He does have ongoing congestion this week.  Normal neuro exam without weakness, ataxia or cerebellar findings on exam.  Normal vision.  Sx are reproducible with movement of the head and attempting to walk.  No hx of Stroke and low likelihood.  normal VS. Will treat for peripheral vertigo with meclizine/zofran and given nasal spray for ongoing congestion and TM effusions    Final Clinical Impressions(s) / ED Diagnoses   Final diagnoses:  Peripheral vertigo, unspecified laterality    New Prescriptions Discharge Medication List as of 04/01/2016  3:45 PM    START taking these medications   Details  fluticasone (FLONASE) 50 MCG/ACT nasal spray Place 1 spray into both nostrils 2 (two) times daily., Starting Sat 04/01/2016, Print    meclizine (ANTIVERT) 25 MG tablet Take 1 tablet (25 mg total) by mouth 3 (three) times daily as needed for dizziness., Starting Sat 04/01/2016, Print    ondansetron (ZOFRAN ODT) 4 MG disintegrating tablet Take 1 tablet (4 mg total) by mouth every 8 (eight) hours as needed for nausea or vomiting., Starting Sat 04/01/2016, Print      I personally performed the services described in this documentation,  which was scribed in my presence.  The recorded information has been reviewed and considered.     Gwyneth SproutWhitney Jefry Lesinski, MD 04/01/16 1627

## 2016-04-01 NOTE — ED Triage Notes (Signed)
Dizziness x 1 week, increases with movement. Pt states today the dizziness was worse and has had N/V. Denies chest pain, SOB, extremity weakness.

## 2017-10-14 ENCOUNTER — Encounter: Payer: Self-pay | Admitting: Emergency Medicine

## 2017-10-14 ENCOUNTER — Other Ambulatory Visit: Payer: Self-pay

## 2017-10-14 ENCOUNTER — Emergency Department (INDEPENDENT_AMBULATORY_CARE_PROVIDER_SITE_OTHER)
Admission: EM | Admit: 2017-10-14 | Discharge: 2017-10-14 | Disposition: A | Discharge status: Home / Self Care | Payer: BLUE CROSS/BLUE SHIELD | Source: Home / Self Care | Attending: Family Medicine | Admitting: Family Medicine

## 2017-10-14 DIAGNOSIS — H1013 Acute atopic conjunctivitis, bilateral: Secondary | ICD-10-CM

## 2017-10-14 HISTORY — DX: Dermatitis, unspecified: L30.9

## 2017-10-14 MED ORDER — KETOROLAC TROMETHAMINE 0.5 % OP SOLN: 1.0000 [drp] | Freq: Four times a day (QID) | OPHTHALMIC | 1 refills | Status: AC

## 2017-10-14 NOTE — ED Provider Notes (Signed)
[facility]    CSN: [id number] Arrival date & time: 10/14/17  1554     History   Chief Complaint Chief Complaint  Patient presents with  . Eye Problem    HPI Lucas Woods is a 52 y.o. male.   Patient complains of one week history of painless mucous discharge from both eyes with minimal redness.  No change in vision.  No foreign body sensation.  The symptoms are somewhat worse in the morning and he complains of itching.  The history is provided by the patient.  Eye Problem  Location:  Both eyes Quality: mild itching. Severity:  Mild Onset quality:  Sudden Duration:  1 week Timing:  Constant Progression:  Unchanged Chronicity:  New Context: not burn, not chemical exposure, not contact lens problem, not direct trauma, not foreign body, not using machinery, not scratch, not smoke exposure and not UV exposure   Relieved by:  None tried Worsened by:  Nothing Ineffective treatments:  None tried Associated symptoms: discharge and itching   Associated symptoms: no blurred vision, no crusting, no decreased vision, no double vision, no facial rash, no headaches, no inflammation, no photophobia, no scotomas, no swelling, no tearing and no tingling   Risk factors: no exposure to pinkeye and no recent URI     Past Medical History:  Diagnosis Date  . Eczema   . Hypercholesteremia   . Reflux   . Seasonal allergies   . Sleep apnea     There are no active problems to display for this patient.   Past Surgical History:  Procedure Laterality Date  . CIRCUMCISION         Home Medications    Prior to Admission medications   Medication Sig Start Date End Date Taking? Authorizing Provider  Armodafinil (NUVIGIL) 200 MG TABS Take by mouth.    [provider]  atorvastatin (LIPITOR) 10 MG tablet Take 10 mg by mouth daily.    [provider]  clomiPRAMINE (ANAFRANIL) 75 MG capsule Take 75 mg by mouth at bedtime.    [provider]    clonazePAM (KLONOPIN) 0.5 MG tablet Take 0.5 mg by mouth at bedtime.      [provider]  clotrimazole (LOTRIMIN) 1 % cream Apply 1 application topically 2 (two) times daily.    [provider]  Dexlansoprazole (DEXILANT PO) Take by mouth.    [provider]  diphenhydrAMINE (BENADRYL) 25 MG tablet Take 50 mg by mouth once as needed. For allergies     [provider]  diphenhydrAMINE (SOMINEX) 25 MG tablet Take 25 mg by mouth at bedtime as needed for sleep.    [provider]  fluticasone (FLONASE) 50 MCG/ACT nasal spray Place 1 spray into both nostrils 2 (two) times daily. 04/01/16   Gwyneth Sprout, MD  folic acid (FOLVITE) 1 MG tablet Take 1 mg by mouth daily.    [provider]  gemfibrozil (LOPID) 600 MG tablet Take 600 mg by mouth 2 (two) times daily before a meal.      [provider]  ibuprofen (ADVIL,MOTRIN) 200 MG tablet Take 600 mg by mouth once as needed. For pain     [provider]  ketorolac (ACULAR) 0.5 % ophthalmic solution Place 1 drop into both eyes 4 (four) times daily. 10/14/17   Lattie Haw, MD  meclizine (ANTIVERT) 25 MG tablet Take 1 tablet (25 mg total) by mouth 3 (three) times daily as needed for dizziness. 04/01/16   Plunkett,  Whitney, MD  meloxicam (MOBIC) 15 MG tablet Take 15 mg by mouth daily.    [provider]  omeprazole (PRILOSEC) 20 MG capsule Take 20 mg by mouth daily.      [provider]  ondansetron (ZOFRAN ODT) 4 MG disintegrating tablet Take 1 tablet (4 mg total) by mouth every 8 (eight) hours as needed for nausea or vomiting. 04/01/16   Gwyneth SproutPlunkett, Whitney, MD  pantoprazole (PROTONIX) 40 MG tablet Take 40 mg by mouth daily.    [provider]  phenyltoloxamine-acetaminophen 30-325 MG per tablet Take 2 tablets by mouth once as needed. For headache    [provider]  predniSONE (DELTASONE) 20 MG tablet 3 tabs po day one, then 2 po daily x 4 days 10/06/14    Lurene ShadowPhelps, Erin O, PA-C  Testosterone Randa Ngo(AXIRON) 30 MG/ACT SOLN Place 3 Squirts onto the skin daily.      [provider]  Testosterone Undecanoate (AVEED IM) Inject into the muscle.    [provider]  triamcinolone cream (KENALOG) 0.1 % Apply 1 application topically 2 (two) times daily. 10/06/14   Lurene ShadowPhelps, Erin O, PA-C    Family History Family History  Problem Relation Age of Onset  . Diabetes Mother   . Heart failure Mother     Social History Social History   Tobacco Use  . Smoking status: Never Smoker  . Smokeless tobacco: Former Engineer, waterUser  Substance Use Topics  . Alcohol use: No  . Drug use: No     Allergies   Patient has no known allergies.   Review of Systems Review of Systems  Eyes: Positive for discharge and itching. Negative for blurred vision, double vision and photophobia.  Neurological: Negative for tingling and headaches.  All other systems reviewed and are negative.    Physical Exam Triage Vital Signs ED Triage Vitals  Enc Vitals Group     BP 10/14/17 1651 135/86     Pulse Rate 10/14/17 1651 88     Resp 10/14/17 1651 18     Temp 10/14/17 1651 99 F (37.2 C)     Temp Source 10/14/17 1651 Oral     SpO2 10/14/17 1651 98 %     Weight 10/14/17 1652 185 lb (83.9 kg)     Height 10/14/17 1652 5\' 7"  (1.702 m)     Head Circumference --      Peak Flow --      Pain Score 10/14/17 1652 0     Pain Loc --      Pain Edu? --      Excl. in GC? --    No data found.  Updated Vital Signs BP 135/86 (BP Location: Right Arm)   Pulse 88   Temp 99 F (37.2 C) (Oral)   Resp 18   Ht 5\' 7"  (1.702 m)   Wt 185 lb (83.9 kg)   SpO2 98%   BMI 28.98 kg/m   Visual Acuity Right Eye Distance:   Left Eye Distance:   Bilateral Distance:    Right Eye Near:   Left Eye Near:    Bilateral Near:     Physical Exam  Constitutional: He appears well-developed and well-nourished. No distress.  HENT:  Head: Normocephalic.  Right Ear: Tympanic membrane, external  ear and ear canal normal.  Left Ear: Tympanic membrane, external ear and ear canal normal.  Nose: Nose normal.  Mouth/Throat: Oropharynx is clear and moist.  Eyes: Pupils are equal, round, and reactive to light. EOM and lids  are normal. Lids are everted and swept, no foreign bodies found. Right eye exhibits discharge. Right eye exhibits no chemosis, no exudate and no hordeolum. No foreign body present in the right eye. Left eye exhibits discharge. Left eye exhibits no chemosis, no exudate and no hordeolum. No foreign body present in the left eye.  Conjunctivae minimally injected bilaterally.  No photophobia.  Neck: Neck supple.  Cardiovascular: Normal rate.  Pulmonary/Chest: Effort normal.  Lymphadenopathy:    He has no cervical adenopathy.  Neurological: He is alert.  Skin: Skin is warm and dry.  Nursing note and vitals reviewed.    UC Treatments / Results  Labs (all labs ordered are listed, but only abnormal results are displayed) Labs Reviewed - No data to display  EKG None  Radiology No results found.  Procedures Procedures (including critical care time)  Medications Ordered in UC Medications - No data to display  Initial Impression / Assessment and Plan / UC Course  I have reviewed the triage vital signs and the nursing notes.  Pertinent labs & imaging results that were available during my care of the patient were reviewed by me and considered in my medical decision making (see chart for details).    Begin Acular. Followup with ophthalmologist if not improving about 5 days.   Final Clinical Impressions(s) / UC Diagnoses   Final diagnoses:  Allergic conjunctivitis of both eyes     Discharge Instructions     Apply a cool, clean washcloth to your eyes for 10-20 minutes, 3-4 times a day.    ED Prescriptions    Medication Sig Dispense Auth. Provider   ketorolac (ACULAR) 0.5 % ophthalmic solution Place 1 drop into both eyes 4 (four) times daily. 5 mL Lattie Haw, MD        Lattie Haw, MD 10/16/17 [phone]

## 2017-10-14 NOTE — Discharge Instructions (Addendum)
Apply a cool, clean washcloth to your eyes for 10-20 minutes, 3-4 times a day.

## 2017-10-14 NOTE — ED Triage Notes (Signed)
Patient states both eyes have become reddened with sticky matter/drainage over past week; some itching.

## 2017-10-26 ENCOUNTER — Encounter: Payer: Self-pay | Admitting: Emergency Medicine

## 2017-10-26 ENCOUNTER — Emergency Department (INDEPENDENT_AMBULATORY_CARE_PROVIDER_SITE_OTHER)
Admission: EM | Admit: 2017-10-26 | Discharge: 2017-10-26 | Disposition: A | Discharge status: Home / Self Care | Payer: BLUE CROSS/BLUE SHIELD | Source: Home / Self Care | Attending: Family Medicine | Admitting: Family Medicine

## 2017-10-26 DIAGNOSIS — T63441A Toxic effect of venom of bees, accidental (unintentional), initial encounter: Secondary | ICD-10-CM | POA: Diagnosis not present

## 2017-10-26 MED ORDER — ONDANSETRON 4 MG PO TBDP: ORAL_TABLET | ORAL | 0 refills | Status: AC

## 2017-10-26 MED ORDER — FAMOTIDINE 40 MG PO TABS
40.0000 mg | ORAL_TABLET | Freq: Once | ORAL | Status: AC
  Administered 2017-10-26: 40 mg via ORAL

## 2017-10-26 MED ORDER — EPINEPHRINE 0.3 MG/0.3ML IJ SOAJ: 0.3000 mg | Freq: Once | INTRAMUSCULAR | 1 refills | Status: AC

## 2017-10-26 MED ORDER — ONDANSETRON 4 MG PO TBDP
4.0000 mg | ORAL_TABLET | Freq: Once | ORAL | Status: AC
  Administered 2017-10-26: 4 mg via ORAL

## 2017-10-26 MED ORDER — PREDNISONE 20 MG PO TABS: ORAL_TABLET | ORAL | 0 refills | Status: AC

## 2017-10-26 MED ORDER — KETOROLAC TROMETHAMINE 60 MG/2ML IM SOLN
60.0000 mg | Freq: Once | INTRAMUSCULAR | Status: AC
  Administered 2017-10-26: 60 mg via INTRAMUSCULAR

## 2017-10-26 MED ORDER — METHYLPREDNISOLONE SODIUM SUCC 125 MG IJ SOLR
125.0000 mg | Freq: Once | INTRAMUSCULAR | Status: AC
  Administered 2017-10-26: 125 mg via INTRAMUSCULAR

## 2017-10-26 NOTE — ED Triage Notes (Signed)
Pt states he was stung by a yellow jacket about 5pm today. He took 3 benadryl. C/o swelling and nausea.

## 2017-10-26 NOTE — ED Provider Notes (Signed)
[facility]    CSN: [id number] Arrival date & time: 10/26/17  1808     History   Chief Complaint Chief Complaint  Patient presents with  . Insect Bite    HPI Lucas Woods is a 52 y.o. male.   HPI  Lucas Woods is a 52 y.o. male presenting to UC with c/o sudden onset swelling in his neck under his chin that started around 5PM after being stung by a yellow jacket once. He took 3 Programme researcher, broadcasting/film/video"dollar store brand benadryl" at 5:30PM. He has mild burning to the area and nausea but denies vomiting, tongue or throat swelling or trouble breathing. No known allergies. He has been stung in the past but does not recall having an allergic reaction. No other stings. He has redness where he was stung but no rashes/hives.    Past Medical History:  Diagnosis Date  . Eczema   . Hypercholesteremia   . Reflux   . Seasonal allergies   . Sleep apnea     There are no active problems to display for this patient.   Past Surgical History:  Procedure Laterality Date  . CIRCUMCISION         Home Medications    Prior to Admission medications   Medication Sig Start Date End Date Taking? Authorizing Provider  Armodafinil (NUVIGIL) 200 MG TABS Take by mouth.    [provider]  atorvastatin (LIPITOR) 10 MG tablet Take 10 mg by mouth daily.    [provider]  clomiPRAMINE (ANAFRANIL) 75 MG capsule Take 75 mg by mouth at bedtime.    [provider]  clonazePAM (KLONOPIN) 0.5 MG tablet Take 0.5 mg by mouth at bedtime.      [provider]  clotrimazole (LOTRIMIN) 1 % cream Apply 1 application topically 2 (two) times daily.    [provider]  Dexlansoprazole (DEXILANT PO) Take by mouth.    [provider]  diphenhydrAMINE (BENADRYL) 25 MG tablet Take 50 mg by mouth once as needed. For allergies     [provider]  diphenhydrAMINE (SOMINEX) 25 MG tablet Take 25 mg by mouth at bedtime as needed for sleep.    [provider]    EPINEPHrine 0.3 mg/0.3 mL IJ SOAJ injection Inject 0.3 mLs (0.3 mg total) into the muscle once for 1 dose. 10/26/17 10/26/17  Lurene ShadowPhelps, Cameo Shewell O, PA-C  fluticasone (FLONASE) 50 MCG/ACT nasal spray Place 1 spray into both nostrils 2 (two) times daily. 04/01/16   Gwyneth SproutPlunkett, Whitney, MD  folic acid (FOLVITE) 1 MG tablet Take 1 mg by mouth daily.    [provider]  gemfibrozil (LOPID) 600 MG tablet Take 600 mg by mouth 2 (two) times daily before a meal.      [provider]  ibuprofen (ADVIL,MOTRIN) 200 MG tablet Take 600 mg by mouth once as needed. For pain     [provider]  ketorolac (ACULAR) 0.5 % ophthalmic solution Place 1 drop into both eyes 4 (four) times daily. 10/14/17   Lattie HawBeese, Stephen A, MD  meclizine (ANTIVERT) 25 MG tablet Take 1 tablet (25 mg total) by mouth 3 (three) times daily as needed for dizziness. 04/01/16   Gwyneth SproutPlunkett, Whitney, MD  meloxicam (MOBIC) 15 MG tablet Take 15 mg by mouth daily.    [provider]  omeprazole (PRILOSEC) 20 MG capsule Take 20 mg by mouth daily.      [provider]  ondansetron (ZOFRAN ODT) 4 MG disintegrating tablet 4mg  ODT q4-6 hours  prn nausea/vomit 10/26/17   Lurene Shadow, PA-C  pantoprazole (PROTONIX) 40 MG tablet Take 40 mg by mouth daily.    [provider]  phenyltoloxamine-acetaminophen 30-325 MG per tablet Take 2 tablets by mouth once as needed. For headache    [provider]  predniSONE (DELTASONE) 20 MG tablet 3 tabs po day one, then 2 po daily x 4 days 10/26/17   Lurene Shadow, PA-C  Testosterone Randa Ngo) 30 MG/ACT SOLN Place 3 Squirts onto the skin daily.      [provider]  Testosterone Undecanoate (AVEED IM) Inject into the muscle.    [provider]  triamcinolone cream (KENALOG) 0.1 % Apply 1 application topically 2 (two) times daily. 10/06/14   Lurene Shadow, PA-C    Family History Family History  Problem Relation Age of Onset  . Diabetes Mother   . Heart  failure Mother     Social History Social History   Tobacco Use  . Smoking status: Never Smoker  . Smokeless tobacco: Former Engineer, water Use Topics  . Alcohol use: No  . Drug use: No     Allergies   Patient has no known allergies.   Review of Systems Review of Systems  Constitutional: Negative for chills and fever.  HENT: Negative for facial swelling, sore throat, trouble swallowing and voice change.   Respiratory: Negative for chest tightness and shortness of breath.   Gastrointestinal: Positive for nausea. Negative for diarrhea and vomiting.  Musculoskeletal: Positive for neck pain ( anterior neck swelling). Negative for neck stiffness.  Skin: Positive for color change. Negative for rash.     Physical Exam Triage Vital Signs ED Triage Vitals  Enc Vitals Group     BP      Pulse      Resp      Temp      Temp src      SpO2      Weight      Height      Head Circumference      Peak Flow      Pain Score      Pain Loc      Pain Edu?      Excl. in GC?    No data found.  Updated Vital Signs BP (!) 154/98 (BP Location: Right Arm)   Pulse 88   Temp 98.4 F (36.9 C) (Oral)   SpO2 98%   Visual Acuity Right Eye Distance:   Left Eye Distance:   Bilateral Distance:    Right Eye Near:   Left Eye Near:    Bilateral Near:     Physical Exam  Constitutional: He is oriented to person, place, and time. He appears well-developed and well-nourished. No distress.  HENT:  Head: Normocephalic and atraumatic.  Nose: Nose normal.  Mouth/Throat: Uvula is midline, oropharynx is clear and moist and mucous membranes are normal. No uvula swelling. No posterior oropharyngeal edema or posterior oropharyngeal erythema.  Eyes: EOM are normal.  Neck: Normal range of motion. Neck supple.  Anterior neck: mild to moderate swelling under chin and to anterior neck. Mildly tender. Mild erythema. No puncture wound or stinger visualized. Pt does have a beard.   Cardiovascular: Normal  rate and regular rhythm.  Pulmonary/Chest: Effort normal. No stridor. No respiratory distress.  Musculoskeletal: Normal range of motion.  Neurological: He is alert and oriented to person, place, and time.  Skin: Skin is warm and dry. No rash noted. He is not  diaphoretic.  Psychiatric: He has a normal mood and affect. His behavior is normal.  Nursing note and vitals reviewed.    UC Treatments / Results  Labs (all labs ordered are listed, but only abnormal results are displayed) Labs Reviewed - No data to display  EKG None  Radiology No results found.  Procedures Procedures (including critical care time)  Medications Ordered in UC Medications  methylPREDNISolone sodium succinate (SOLU-MEDROL) 125 mg/2 mL injection 125 mg (125 mg Intramuscular Given 10/26/17 1839)  ondansetron (ZOFRAN-ODT) disintegrating tablet 4 mg (4 mg Oral Given 10/26/17 1832)  ketorolac (TORADOL) injection 60 mg (60 mg Intramuscular Given 10/26/17 1839)  famotidine (PEPCID) tablet 40 mg (40 mg Oral Given 10/26/17 1832)    Initial Impression / Assessment and Plan / UC Course  I have reviewed the triage vital signs and the nursing notes.  Pertinent labs & imaging results that were available during my care of the patient were reviewed by me and considered in my medical decision making (see chart for details).     Concern for potential early anaphylaxis due to reported nausea following the sting. Localized swelling around neck where sting was but no oral swelling, no urticaria. No vomiting or diaphoresis.   Pt monitored in UC for over 1 hour. Slight decreased swelling in the neck. Pt states he feels well. No nausea. Still no oral swelling or urticaria.  Pt safe for discharge home. Pt info packet provided and Discussed symptoms that warrant emergent care in the ED. Pt and wife verbalized understanding and agreement with tx plan.   Final Clinical Impressions(s) / UC Diagnoses   Final diagnoses:  Bee sting  reaction, accidental or unintentional, initial encounter     Discharge Instructions       You were given a shot of solumedrol (a steroid) today to help with itching and swelling from a likely allergic reaction.  You have been prescribed 5 days of prednisone, an oral steroid.  You may start this medication tomorrow with breakfast.    Please call 911 or go to the emergency department if symptoms worsening this evening or this weekend- worsening swelling, trouble breathing, develop rash (hives), or other new concerning symptoms develop.     ED Prescriptions    Medication Sig Dispense Auth. Provider   ondansetron (ZOFRAN ODT) 4 MG disintegrating tablet 4mg  ODT q4-6 hours prn nausea/vomit 10 tablet Doroteo Glassman, Stephaney Steven O, PA-C   EPINEPHrine 0.3 mg/0.3 mL IJ SOAJ injection Inject 0.3 mLs (0.3 mg total) into the muscle once for 1 dose. 1 Device Easton Fetty O, PA-C   predniSONE (DELTASONE) 20 MG tablet 3 tabs po day one, then 2 po daily x 4 days 11 tablet Lurene Shadow, PA-C     Controlled Substance Prescriptions [place] Controlled Substance Registry consulted? Not Applicable   Rolla Plate 10/26/17 1955

## 2017-10-26 NOTE — Discharge Instructions (Signed)
° °  You were given a shot of solumedrol (a steroid) today to help with itching and swelling from a likely allergic reaction.  You have been prescribed 5 days of prednisone, an oral steroid.  You may start this medication tomorrow with breakfast.    Please call 911 or go to the emergency department if symptoms worsening this evening or this weekend- worsening swelling, trouble breathing, develop rash (hives), or other new concerning symptoms develop.

## 2017-10-27 ENCOUNTER — Telehealth: Payer: Self-pay | Admitting: Emergency Medicine

## 2020-03-27 ENCOUNTER — Emergency Department
Admission: EM | Admit: 2020-03-27 | Discharge: 2020-03-27 | Disposition: A | Discharge status: Home / Self Care | Payer: BC Managed Care – PPO | Source: Home / Self Care | Attending: Family Medicine | Admitting: Family Medicine

## 2020-03-27 ENCOUNTER — Other Ambulatory Visit: Payer: Self-pay

## 2020-03-27 DIAGNOSIS — L03115 Cellulitis of right lower limb: Secondary | ICD-10-CM

## 2020-03-27 MED ORDER — DOXYCYCLINE HYCLATE 100 MG PO CAPS: 100.0000 mg | ORAL_CAPSULE | Freq: Two times a day (BID) | ORAL | 0 refills | Status: AC

## 2020-03-27 MED ORDER — SARNA 0.5-0.5 % EX LOTN
1.0000 | TOPICAL_LOTION | CUTANEOUS | 0 refills | Status: AC | PRN
Start: 2020-03-27 — End: ?

## 2020-03-27 NOTE — ED Provider Notes (Signed)
[name]    CSN: [id number] Arrival date & time: 03/27/20  1343      History   Chief Complaint Chief Complaint  Patient presents with  . Rash    HPI Lucas Woods is a 55 y.o. male.   Patient has several bites around his ankles and feet.  There is one on the posterior heel with some surrounding inflammation appearing to be possible cellulitis.  No known etiology.  He does have some cats.  Potential is for fleas.  No one else in the house is itching or has a rash.  HPI  Past Medical History:  Diagnosis Date  . Eczema   . Hypercholesteremia   . Reflux   . Seasonal allergies   . Sleep apnea     There are no problems to display for this patient.   Past Surgical History:  Procedure Laterality Date  . CIRCUMCISION         Home Medications    Prior to Admission medications   Medication Sig Start Date End Date Taking? Authorizing Provider  methotrexate (RHEUMATREX) 2.5 MG tablet Take 2.5 mg by mouth once a week. UNSURE OF DOSE--states 6 tabs weekly   Yes [provider]  Armodafinil (NUVIGIL) 200 MG TABS Take by mouth.    [provider]  atorvastatin (LIPITOR) 10 MG tablet Take 10 mg by mouth daily.    [provider]  clomiPRAMINE (ANAFRANIL) 75 MG capsule Take 75 mg by mouth at bedtime.    [provider]  clonazePAM (KLONOPIN) 0.5 MG tablet Take 0.5 mg by mouth at bedtime.      [provider]  clotrimazole (LOTRIMIN) 1 % cream Apply 1 application topically 2 (two) times daily.    [provider]  Dexlansoprazole (DEXILANT PO) Take by mouth.    [provider]  diphenhydrAMINE (BENADRYL) 25 MG tablet Take 50 mg by mouth once as needed. For allergies     [provider]  diphenhydrAMINE (SOMINEX) 25 MG tablet Take 25 mg by mouth at bedtime as needed for sleep.    [provider]  fluticasone (FLONASE) 50 MCG/ACT nasal spray Place 1 spray into both nostrils 2 (two) times daily.  04/01/16   Blanchie Dessert, MD  folic acid (FOLVITE) 1 MG tablet Take 1 mg by mouth daily.    [provider]  gemfibrozil (LOPID) 600 MG tablet Take 600 mg by mouth 2 (two) times daily before a meal.      [provider]  ibuprofen (ADVIL,MOTRIN) 200 MG tablet Take 600 mg by mouth once as needed. For pain     [provider]  ketorolac (ACULAR) 0.5 % ophthalmic solution Place 1 drop into both eyes 4 (four) times daily. 10/14/17   Kandra Nicolas, MD  meclizine (ANTIVERT) 25 MG tablet Take 1 tablet (25 mg total) by mouth 3 (three) times daily as needed for dizziness. 04/01/16   Blanchie Dessert, MD  meloxicam (MOBIC) 15 MG tablet Take 15 mg by mouth daily.    [provider]  omeprazole (PRILOSEC) 20 MG capsule Take 20 mg by mouth daily.    [provider]  ondansetron (ZOFRAN ODT) 4 MG disintegrating tablet 4mg  ODT q4-6 hours prn nausea/vomit 10/26/17   Noe Gens, PA-C  pantoprazole (PROTONIX) 40 MG tablet Take 40 mg by mouth daily.    [provider]  phenyltoloxamine-acetaminophen 30-325 MG per tablet Take 2 tablets by mouth once as needed. For headache  [provider]  predniSONE (DELTASONE) 20 MG tablet 3 tabs po day one, then 2 po daily x 4 days 10/26/17   Lurene Shadow, PA-C  Testosterone Randa Ngo) 30 MG/ACT SOLN Place 3 Squirts onto the skin daily.      [provider]  Testosterone Undecanoate (AVEED IM) Inject into the muscle.    [provider]  triamcinolone cream (KENALOG) 0.1 % Apply 1 application topically 2 (two) times daily. 10/06/14   Lurene Shadow, PA-C    Family History Family History  Problem Relation Age of Onset  . Diabetes Mother   . Heart failure Mother   . Dementia Father     Social History Social History   Tobacco Use  . Smoking status: Never Smoker  . Smokeless tobacco: Former Clinical biochemist  . Vaping Use: Never used  Substance Use Topics  . Alcohol use: No  . Drug use:  No     Allergies   Patient has no known allergies.   Review of Systems Review of Systems  Skin: Positive for rash.       Bilateral ankles  All other systems reviewed and are negative.    Physical Exam Triage Vital Signs ED Triage Vitals  Enc Vitals Group     BP 03/27/20 1503 (!) 131/93     Pulse Rate 03/27/20 1503 90     Resp 03/27/20 1503 18     Temp 03/27/20 1503 99.3 F (37.4 C)     Temp Source 03/27/20 1503 Oral     SpO2 03/27/20 1503 97 %     Weight 03/27/20 1502 190 lb (86.2 kg)     Height 03/27/20 1502 5\' 6"  (1.676 m)     Head Circumference --      Peak Flow --      Pain Score 03/27/20 1501 0     Pain Loc --      Pain Edu? --      Excl. in GC? --    No data found.  Updated Vital Signs BP (!) 131/93 (BP Location: Right Arm)   Pulse 90   Temp 99.3 F (37.4 C) (Oral)   Resp 18   Ht 5\' 6"  (1.676 m)   Wt 86.2 kg   SpO2 97%   BMI 30.67 kg/m   Visual Acuity Right Eye Distance:   Left Eye Distance:   Bilateral Distance:    Right Eye Near:   Left Eye Near:    Bilateral Near:     Physical Exam Vitals and nursing note reviewed.  Constitutional:      Appearance: Normal appearance.  Skin:    Comments: Multiple insect bites around ankles.  One bite appears to have some surrounding erythema and increased temp consistent with cellulitis Question etiology of bite.  Possible explanation is fully  Neurological:     Mental Status: He is alert.      UC Treatments / Results  Labs (all labs ordered are listed, but only abnormal results are displayed) Labs Reviewed - No data to display  EKG   Radiology No results found.  Procedures Procedures (including critical care time)  Medications Ordered in UC Medications - No data to display  Initial Impression / Assessment and Plan / UC Course  I have reviewed the triage vital signs and the nursing notes.  Pertinent labs & imaging results that were available during my care of the patient were reviewed  by me and considered in my medical decision making (  see chart for details).     Insect bites and cellulitis/ankles Final Clinical Impressions(s) / UC Diagnoses   Final diagnoses:  None   Discharge Instructions   None    ED Prescriptions    None     PDMP not reviewed this encounter.   Frederica Kuster, MD 03/27/20 [phone]

## 2020-03-27 NOTE — ED Triage Notes (Signed)
Pt presents to Urgent Care with c/o itching and supposed insect bites/rash to bil ankles and R wrist. R ankle noted to be swollen.

## 2021-02-27 ENCOUNTER — Emergency Department (HOSPITAL_BASED_OUTPATIENT_CLINIC_OR_DEPARTMENT_OTHER)
Admission: EM | Admit: 2021-02-27 | Discharge: 2021-02-27 | Disposition: A | Discharge status: Home / Self Care | Payer: BC Managed Care – PPO | Attending: Emergency Medicine | Admitting: Emergency Medicine

## 2021-02-27 ENCOUNTER — Emergency Department (HOSPITAL_BASED_OUTPATIENT_CLINIC_OR_DEPARTMENT_OTHER): Payer: BC Managed Care – PPO

## 2021-02-27 ENCOUNTER — Other Ambulatory Visit: Payer: Self-pay

## 2021-02-27 ENCOUNTER — Encounter (HOSPITAL_BASED_OUTPATIENT_CLINIC_OR_DEPARTMENT_OTHER): Payer: Self-pay

## 2021-02-27 DIAGNOSIS — Z20822 Contact with and (suspected) exposure to covid-19: Secondary | ICD-10-CM | POA: Insufficient documentation

## 2021-02-27 DIAGNOSIS — H11433 Conjunctival hyperemia, bilateral: Secondary | ICD-10-CM | POA: Insufficient documentation

## 2021-02-27 DIAGNOSIS — R0981 Nasal congestion: Secondary | ICD-10-CM | POA: Insufficient documentation

## 2021-02-27 DIAGNOSIS — Z87891 Personal history of nicotine dependence: Secondary | ICD-10-CM | POA: Insufficient documentation

## 2021-02-27 DIAGNOSIS — R5383 Other fatigue: Secondary | ICD-10-CM | POA: Diagnosis not present

## 2021-02-27 DIAGNOSIS — R051 Acute cough: Secondary | ICD-10-CM | POA: Insufficient documentation

## 2021-02-27 LAB — RESP PANEL BY RT-PCR (FLU A&B, COVID) ARPGX2
Influenza A by PCR: NEGATIVE
Influenza B by PCR: NEGATIVE
SARS Coronavirus 2 by RT PCR: NEGATIVE

## 2021-02-27 MED ORDER — BENZONATATE 100 MG PO CAPS: 100.0000 mg | ORAL_CAPSULE | Freq: Three times a day (TID) | ORAL | 0 refills | Status: AC

## 2021-02-27 MED ORDER — POLYMYXIN B-TRIMETHOPRIM 10000-0.1 UNIT/ML-% OP SOLN: 1.0000 [drp] | OPHTHALMIC | 0 refills | Status: AC

## 2021-02-27 NOTE — Discharge Instructions (Addendum)
Please place 1 drop in each eye every 6 hours for the next 7 days.  Chest x-ray did not reveal any signs of pneumonia.  This is likely an inflammation from your mold exposure or viral illness.  Near COVID and flu were negative.  Please take over-the-counter DayQuil/NyQuil for symptoms.  We will also provide you with Tessalon Perles for cough.  Please return to the emergency department if you experience worsening cough, trouble breathing, severe chest pains, fever that would not go down with Tylenol or ibuprofen, or any other concerns you might have.

## 2021-02-27 NOTE — ED Notes (Signed)
D/c paperwork reviewed with pt, including prescriptions. Pt educated about high BP, pt verbalized understanding. No questions or concerns at time of d/c. Ambulatory to ED exit.

## 2021-02-27 NOTE — ED Provider Notes (Signed)
[facility] EMERGENCY DEPARTMENT Provider Note   CSN: [id number] Arrival date & time: 02/27/21  1414     History Chief Complaint  Patient presents with   Cough    Lucas Woods is a 55 y.o. male who presents the emergency department with cough and upper respiratory symptoms over the last 2 weeks.  Patient states that he has been cleaning out various locations with known mold exposure.  He was not wearing any protective respiratory masks.  Patient complains of cough characterizes productive with brown sputum.  He also reports associated eye redness bilaterally with itching, nasal congestion, increased level of fatigue.  He denies any abdominal pain, nausea, vomiting, diarrhea.   Cough     Past Medical History:  Diagnosis Date   Eczema    Hypercholesteremia    Reflux    Seasonal allergies    Sleep apnea     There are no problems to display for this patient.   Past Surgical History:  Procedure Laterality Date   CIRCUMCISION         Family History  Problem Relation Age of Onset   Diabetes Mother    Heart failure Mother    Dementia Father     Social History   Tobacco Use   Smoking status: Never   Smokeless tobacco: Former  Building services engineer Use: Never used  Substance Use Topics   Alcohol use: No   Drug use: No    Home Medications Prior to Admission medications   Medication Sig Start Date End Date Taking? Authorizing Provider  benzonatate (TESSALON) 100 MG capsule Take 1 capsule (100 mg total) by mouth every 8 (eight) hours. 02/27/21  Yes Kinzi Frediani M, PA-C  trimethoprim-polymyxin b (POLYTRIM) ophthalmic solution Place 1 drop into both eyes every 4 (four) hours. 02/27/21  Yes Shevy Yaney M, PA-C  Armodafinil (NUVIGIL) 200 MG TABS Take by mouth.    [provider]  atorvastatin (LIPITOR) 10 MG tablet Take 10 mg by mouth daily.    [provider]  camphor-menthol Wynelle Fanny) lotion Apply 1 application topically as needed for  itching. 03/27/20   Frederica Kuster, MD  clomiPRAMINE (ANAFRANIL) 75 MG capsule Take 75 mg by mouth at bedtime.    [provider]  clonazePAM (KLONOPIN) 0.5 MG tablet Take 0.5 mg by mouth at bedtime.      [provider]  clotrimazole (LOTRIMIN) 1 % cream Apply 1 application topically 2 (two) times daily.    [provider]  Dexlansoprazole (DEXILANT PO) Take by mouth.    [provider]  diphenhydrAMINE (BENADRYL) 25 MG tablet Take 50 mg by mouth once as needed. For allergies     [provider]  diphenhydrAMINE (SOMINEX) 25 MG tablet Take 25 mg by mouth at bedtime as needed for sleep.    [provider]  doxycycline (VIBRAMYCIN) 100 MG capsule Take 1 capsule (100 mg total) by mouth 2 (two) times daily. 03/27/20   Frederica Kuster, MD  fluticasone (FLONASE) 50 MCG/ACT nasal spray Place 1 spray into both nostrils 2 (two) times daily. 04/01/16   Gwyneth Sprout, MD  folic acid (FOLVITE) 1 MG tablet Take 1 mg by mouth daily.    [provider]  gemfibrozil (LOPID) 600 MG tablet Take 600 mg by mouth 2 (two) times daily before a meal.      [provider]  ibuprofen (ADVIL,MOTRIN) 200 MG tablet Take 600 mg by mouth once as needed. For pain  [provider]  ketorolac (ACULAR) 0.5 % ophthalmic solution Place 1 drop into both eyes 4 (four) times daily. 10/14/17   Lattie Haw, MD  meclizine (ANTIVERT) 25 MG tablet Take 1 tablet (25 mg total) by mouth 3 (three) times daily as needed for dizziness. 04/01/16   Gwyneth Sprout, MD  meloxicam (MOBIC) 15 MG tablet Take 15 mg by mouth daily.    [provider]  methotrexate (RHEUMATREX) 2.5 MG tablet Take 2.5 mg by mouth once a week. UNSURE OF DOSE--states 6 tabs weekly    [provider]  omeprazole (PRILOSEC) 20 MG capsule Take 20 mg by mouth daily.    [provider]  ondansetron (ZOFRAN ODT) 4 MG disintegrating tablet 4mg  ODT q4-6 hours prn  nausea/vomit 10/26/17   12/26/17, PA-C  pantoprazole (PROTONIX) 40 MG tablet Take 40 mg by mouth daily.    [provider]  phenyltoloxamine-acetaminophen 30-325 MG per tablet Take 2 tablets by mouth once as needed. For headache    [provider]  predniSONE (DELTASONE) 20 MG tablet 3 tabs po day one, then 2 po daily x 4 days 10/26/17   12/26/17, PA-C  Testosterone Lurene Shadow) 30 MG/ACT SOLN Place 3 Squirts onto the skin daily.      [provider]  Testosterone Undecanoate (AVEED IM) Inject into the muscle.    [provider]  triamcinolone cream (KENALOG) 0.1 % Apply 1 application topically 2 (two) times daily. 10/06/14   12/07/14, PA-C    Allergies    Bee venom  Review of Systems   Review of Systems  Respiratory:  Positive for cough.   All other systems reviewed and are negative.  Physical Exam Updated Vital Signs BP (!) 149/110 (BP Location: Right Arm)   Pulse 91   Temp 98.5 F (36.9 C) (Oral)   Resp 18   Ht 5\' 7"  (1.702 m)   Wt 86.2 kg   SpO2 97%   BMI 29.76 kg/m   Physical Exam Vitals and nursing note reviewed.  Constitutional:      General: He is not in acute distress.    Appearance: Normal appearance.  HENT:     Head: Normocephalic and atraumatic.  Eyes:     Conjunctiva/sclera:     Right eye: Right conjunctiva is injected.     Left eye: Left conjunctiva is injected.  Cardiovascular:     Comments: Regular rate and rhythm.  S1/S2 are distinct without any evidence of murmur, rubs, or gallops.  Radial pulses are 2+ bilaterally.  Dorsalis pedis pulses are 2+ bilaterally.  No evidence of pedal edema. Pulmonary:     Comments: Clear to auscultation bilaterally.  Normal effort.  No respiratory distress.  No evidence of wheezes, rales, or rhonchi heard throughout. Abdominal:     General: Abdomen is flat. Bowel sounds are normal. There is no distension.     Tenderness: There is no abdominal tenderness. There is no guarding or  rebound.  Musculoskeletal:        General: Normal range of motion.     Cervical back: Neck supple.  Skin:    General: Skin is warm and dry.     Findings: No rash.  Neurological:     General: No focal deficit present.     Mental Status: He is alert.  Psychiatric:        Mood and Affect: Mood normal.        Behavior: Behavior normal.  ED Results / Procedures / Treatments   Labs (all labs ordered are listed, but only abnormal results are displayed) Labs Reviewed  RESP PANEL BY RT-PCR (FLU A&B, COVID) [id number]    EKG None  Radiology DG Chest 2 View  Result Date: 02/27/2021 CLINICAL DATA:  Cough and congestion. EXAM: CHEST - 2 VIEW COMPARISON:  July 23, 2015. FINDINGS: Cardiomediastinal silhouette is normal. Mediastinal contours appear intact. There is no evidence of focal airspace consolidation, pleural effusion or pneumothorax. Anterior compression deformity of T9 vertebral body, stable. IMPRESSION: No active cardiopulmonary disease. Electronically Signed   By: Ted Mcalpine M.D.   On: 02/27/2021 17:27    Procedures Procedures   Medications Ordered in ED Medications - No data to display  ED Course  I have reviewed the triage vital signs and the nursing notes.  Pertinent labs & imaging results that were available during my care of the patient were reviewed by me and considered in my medical decision making (see chart for details).     MDM Rules/Calculators/A&P                          Lucas Woods is a 55 y.o. male who presents the emergency department with upper respiratory symptoms.  Patient does have mold exposure.  He is currently in no respiratory distress.  Will order respiratory panel and chest x-ray.  This could be viral or likely irritation from his fungal exposure.  Respiratory panel was negative for any COVID or influenza.  Chest x-ray is negative for pneumonia.  On reevaluation, patient is still in no respiratory distress.  He does mention that his wife  had similar symptoms however his symptoms started first.  She is feeling better. Still could be viral illness versus irritation secondary to his mold exposure. Given his conjunctival injection bilaterally I will treat him with antibacterial drops with steroids.  With regards to the rest of his symptoms will treat with over-the-counter remedies.  I will however prescribe her Tessalon Perles for cough.  Discussed these at length with the patient.  Patient expressed full understanding.  Strict turn precautions given.  He is safe for discharge.   Final Clinical Impression(s) / ED Diagnoses Final diagnoses:  Acute cough  Conjunctival injection, bilateral    Rx / DC Orders ED Discharge Orders          Ordered    trimethoprim-polymyxin b (POLYTRIM) ophthalmic solution  Every 4 hours        02/27/21 1808    benzonatate (TESSALON) 100 MG capsule  Every 8 hours        02/27/21 1808             Honor Loh Malcolm, PA-C 02/27/21 1815    Alvira Monday, MD 02/28/21 1438

## 2021-02-27 NOTE — ED Triage Notes (Signed)
States has been cleaning up mold. C/o cough, runny nose, red eyes, congestion. Denise shortness of breath.

## 2022-12-16 IMAGING — CR DG CHEST 2V
2 series · 2 of 2 positions shown · non-contrast
Comparison: July 23, 2015.

CLINICAL DATA: Cough and congestion.

EXAM:
CHEST - 2 VIEW

[w chest pa]
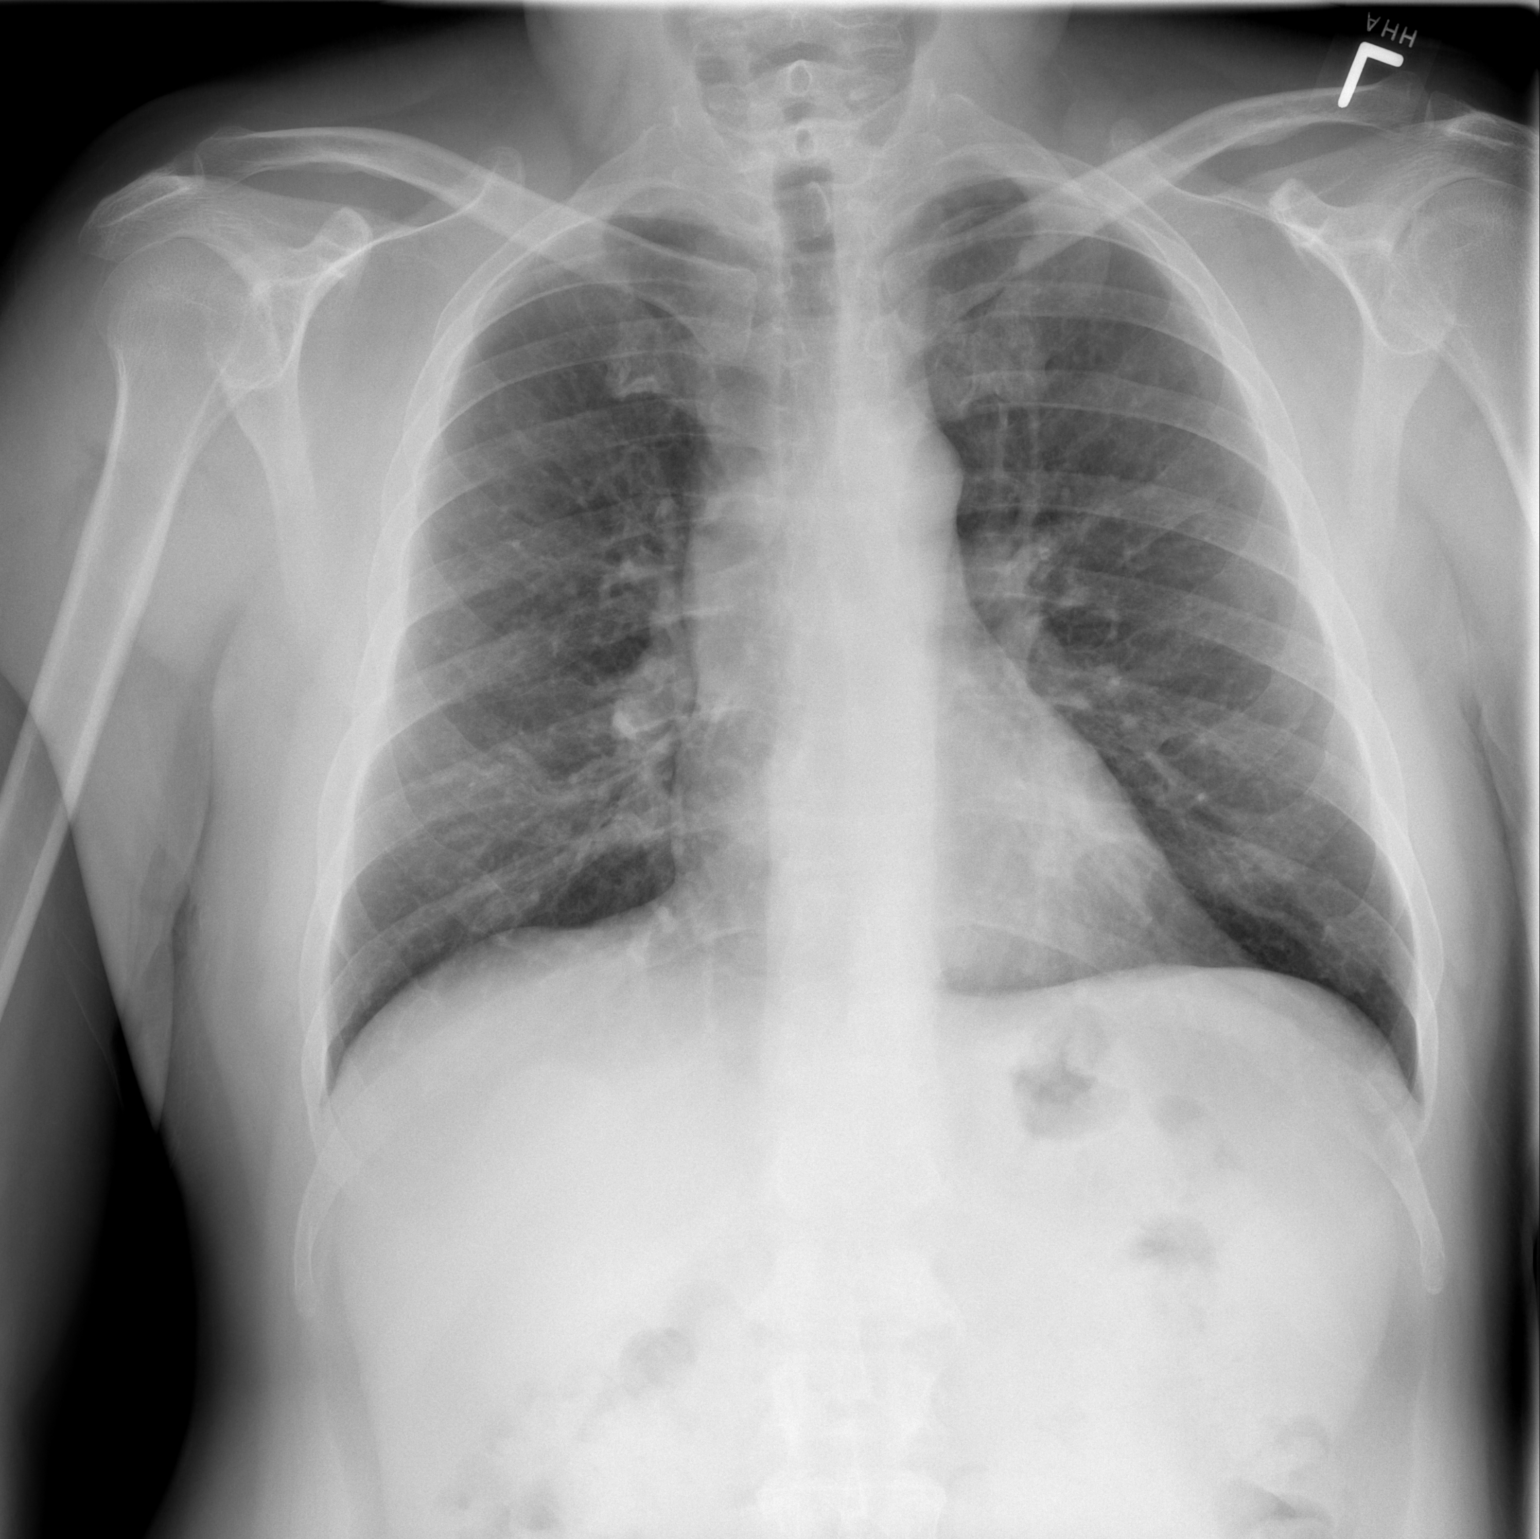

[w chest lat]
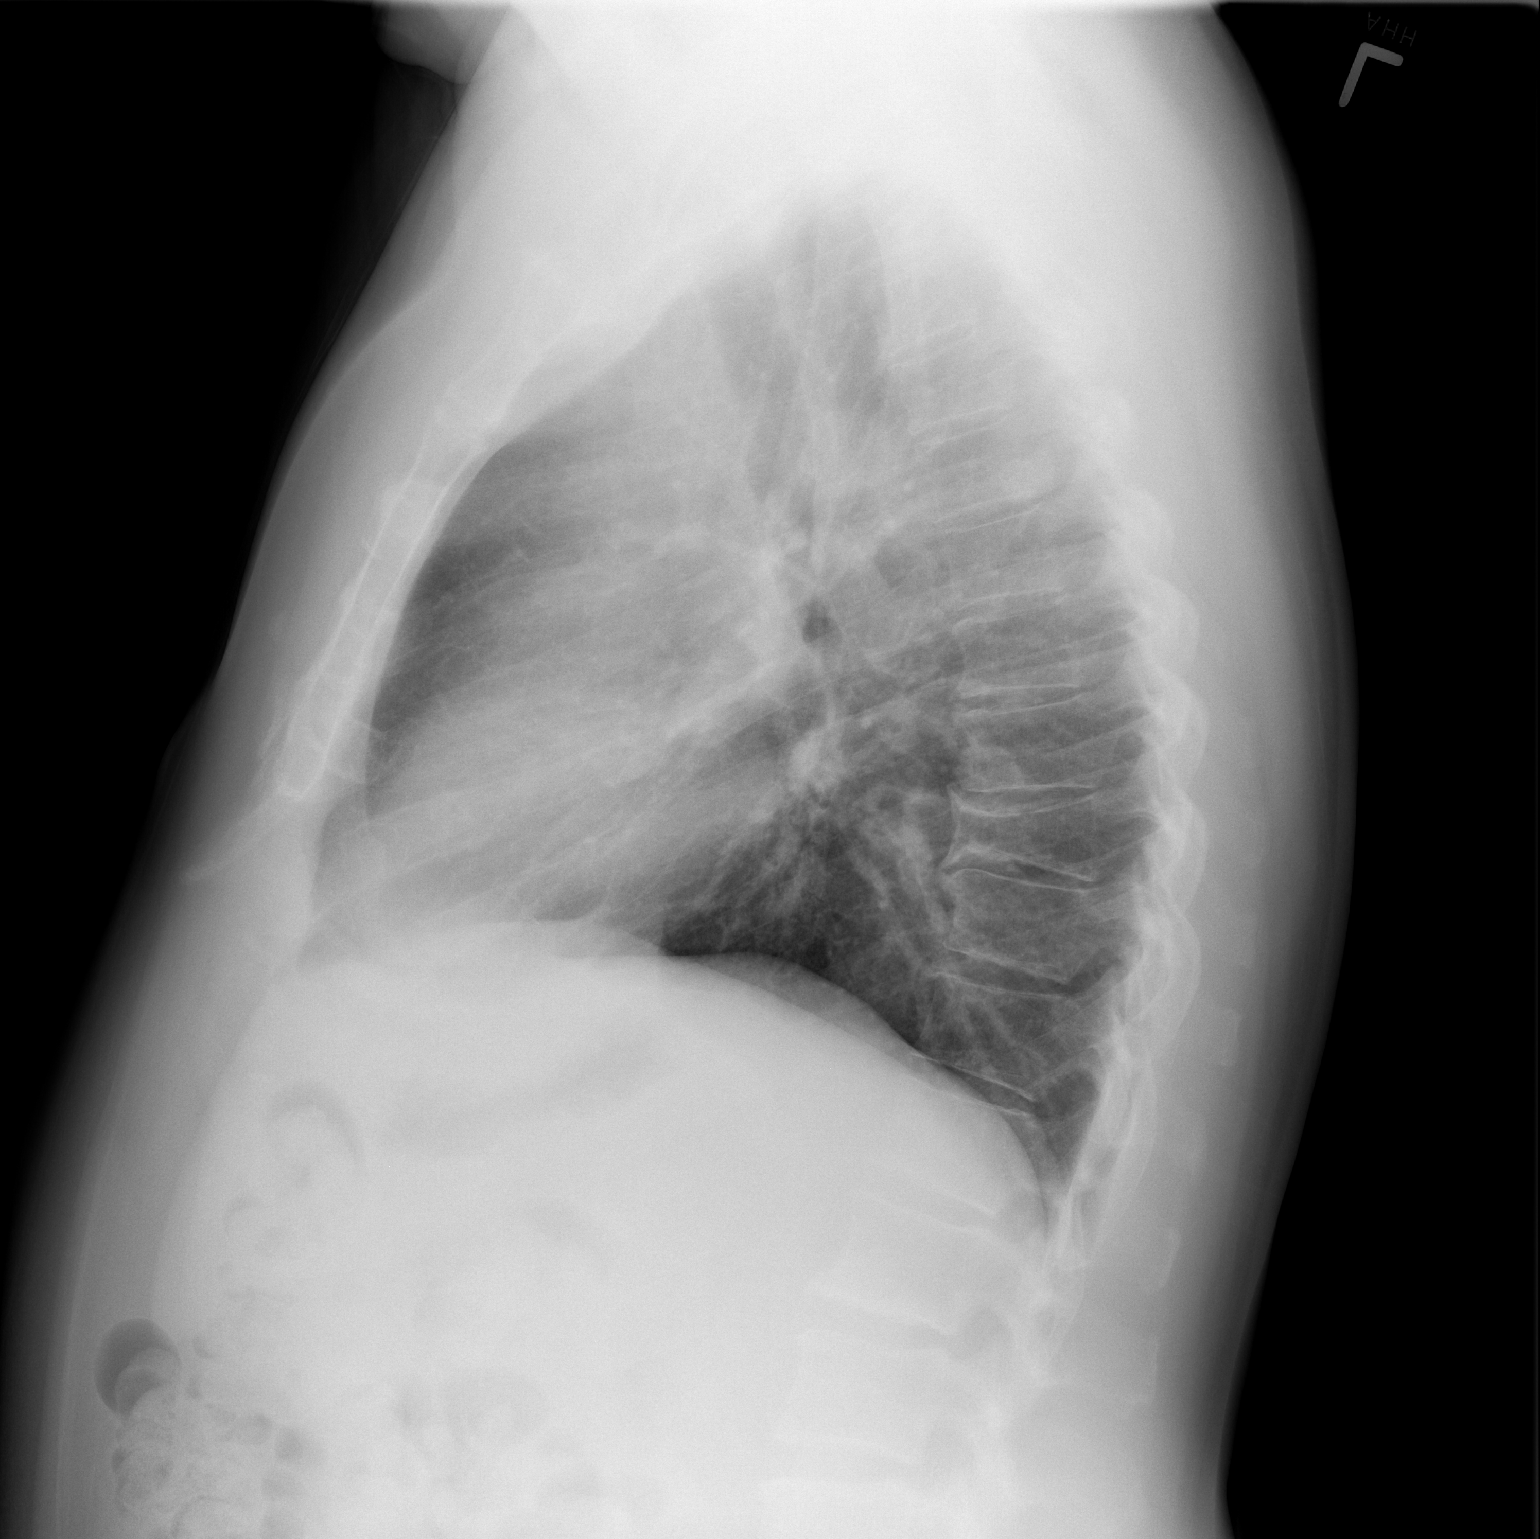

[2 of 2 positions shown; findings below may reference images not displayed]

FINDINGS: Cardiomediastinal silhouette is normal. Mediastinal contours appear
intact.

There is no evidence of focal airspace consolidation, pleural
effusion or pneumothorax.

Anterior compression deformity of T9 vertebral body, stable.
IMPRESSION: No active cardiopulmonary disease.
